# Patient Record
Sex: Male | Born: 1965 | Race: White | Hispanic: No | Smoking: Never smoker
Health system: Southern US, Community
[De-identification: ages and names within clinical notes are randomized; demographics above are authoritative.]

## PROBLEM LIST (undated history)

## (undated) DIAGNOSIS — K219 Gastro-esophageal reflux disease without esophagitis: Secondary | ICD-10-CM

## (undated) DIAGNOSIS — M1990 Unspecified osteoarthritis, unspecified site: Secondary | ICD-10-CM

## (undated) HISTORY — PX: ANKLE SURGERY: SHX546

## (undated) HISTORY — PX: REFRACTIVE SURGERY: SHX103

---

## 2014-03-25 ENCOUNTER — Other Ambulatory Visit: Payer: Self-pay | Admitting: Orthopedic Surgery

## 2014-04-09 NOTE — Pre-Procedure Instructions (Signed)
Kathrynn Speedhomas Granade  04/09/2014   Your procedure is scheduled on:  Mon, Jan 18 @ 7:30 AM  [facility]  at 5:30 AM.  Call this number if you have problems the morning of surgery: [phone]   Remember:   Do not eat food or drink liquids after midnight.   Take these medicines the morning of surgery with A SIP OF WATER:    PEPCID, TRAMADOL IF NEEDED (STOP  NABUMETONE)               No Goody's,BC's,Aleve,Aspirin,Ibuprofen,Fish Oil,or any Herbal Medications   Do not wear jewelry  Do not wear lotions, powders, or colognes. You may wear deodorant.  Men may shave face and neck.  Do not bring valuables to the hospital.  [facility] is not responsible                  for any belongings or valuables.               Contacts, dentures or bridgework may not be worn into surgery.  Leave suitcase in the car. After surgery it may be brought to your room.  For patients admitted to the hospital, discharge time is determined by your                treatment team.                   Special Instructions:  [facility] - Preparing for Surgery  Before surgery, you can play an important role.  Because skin is not sterile, your skin needs to be as free of germs as possible.  You can reduce the number of germs on you skin by washing with CHG (chlorahexidine gluconate) soap before surgery.  CHG is an antiseptic cleaner which kills germs and bonds with the skin to continue killing germs even after washing.  Please DO NOT use if you have an allergy to CHG or antibacterial soaps.  If your skin becomes reddened/irritated stop using the CHG and inform your nurse when you arrive at Short Stay.  Do not shave (including legs and underarms) for at least 48 hours prior to the first CHG shower.  You may shave your face.  Please follow these instructions carefully:   1.  Shower with CHG Soap the night before surgery and the                                morning of Surgery.  2.  If you choose to wash your  hair, wash your hair first as usual with your       normal shampoo.  3.  After you shampoo, rinse your hair and body thoroughly to remove the                      Shampoo.  4.  Use CHG as you would any other liquid soap.  You can apply chg directly       to the skin and wash gently with scrungie or a clean washcloth.  5.  Apply the CHG Soap to your body ONLY FROM THE NECK DOWN.        Do not use on open wounds or open sores.  Avoid contact with your eyes,       ears, mouth and genitals (private parts).  Wash genitals (private parts)       with your  normal soap.  6.  Wash thoroughly, paying special attention to the area where your surgery        will be performed.  7.  Thoroughly rinse your body with warm water from the neck down.  8.  DO NOT shower/wash with your normal soap after using and rinsing off       the CHG Soap.  9.  Pat yourself dry with a clean towel.            10.  Wear clean pajamas.            11.  Place clean sheets on your bed the night of your first shower and do not        sleep with pets.  Day of Surgery  Do not apply any lotions/deoderants the morning of surgery.  Please wear clean clothes to the hospital/surgery center.     Please read over the following fact sheets that you were given: Pain Booklet, Coughing and Deep Breathing, Blood Transfusion Information, MRSA Information and Surgical Site Infection Prevention

## 2014-04-10 ENCOUNTER — Encounter (HOSPITAL_COMMUNITY): Payer: Self-pay

## 2014-04-10 ENCOUNTER — Encounter (HOSPITAL_COMMUNITY)
Admission: RE | Admit: 2014-04-10 | Discharge: 2014-04-10 | Disposition: A | Discharge status: Home / Self Care | Payer: 59 | Source: Ambulatory Visit | Attending: Orthopedic Surgery | Admitting: Orthopedic Surgery

## 2014-04-10 DIAGNOSIS — Z01818 Encounter for other preprocedural examination: Secondary | ICD-10-CM | POA: Diagnosis not present

## 2014-04-10 HISTORY — DX: Gastro-esophageal reflux disease without esophagitis: K21.9

## 2014-04-10 LAB — PROTIME-INR
INR: 0.98 (ref 0.00–1.49)
Prothrombin Time: 13.1 seconds (ref 11.6–15.2)

## 2014-04-10 LAB — BASIC METABOLIC PANEL
ANION GAP: 5 (ref 5–15)
BUN: 12 mg/dL (ref 6–23)
CO2: 29 mmol/L (ref 19–32)
Calcium: 9.2 mg/dL (ref 8.4–10.5)
Chloride: 108 mEq/L (ref 96–112)
Creatinine, Ser: 1.14 mg/dL (ref 0.50–1.35)
GFR, EST AFRICAN AMERICAN: 86 mL/min — AB (ref >90)
GFR, EST NON AFRICAN AMERICAN: 74 mL/min — AB (ref >90)
GLUCOSE: 98 mg/dL (ref 70–99)
POTASSIUM: 4.3 mmol/L (ref 3.5–5.1)
SODIUM: 142 mmol/L (ref 135–145)

## 2014-04-10 LAB — CBC WITH DIFFERENTIAL/PLATELET
BASOS ABS: 0 10*3/uL (ref 0.0–0.1)
Basophils Relative: 0 % (ref 0–1)
EOS ABS: 0.1 10*3/uL (ref 0.0–0.7)
EOS PCT: 2 % (ref 0–5)
HEMATOCRIT: 39.5 % (ref 39.0–52.0)
HEMOGLOBIN: 14.1 g/dL (ref 13.0–17.0)
LYMPHS PCT: 24 % (ref 12–46)
Lymphs Abs: 1.2 10*3/uL (ref 0.7–4.0)
MCH: 29.7 pg (ref 26.0–34.0)
MCHC: 35.7 g/dL (ref 30.0–36.0)
MCV: 83.3 fL (ref 78.0–100.0)
Monocytes Absolute: 0.4 10*3/uL (ref 0.1–1.0)
Monocytes Relative: 9 % (ref 3–12)
NEUTROS PCT: 65 % (ref 43–77)
Neutro Abs: 3.3 10*3/uL (ref 1.7–7.7)
PLATELETS: 176 10*3/uL (ref 150–400)
RBC: 4.74 MIL/uL (ref 4.22–5.81)
RDW: 12.1 % (ref 11.5–15.5)
WBC: 5 10*3/uL (ref 4.0–10.5)

## 2014-04-10 LAB — URINALYSIS, ROUTINE W REFLEX MICROSCOPIC
Bilirubin Urine: NEGATIVE
Glucose, UA: NEGATIVE mg/dL
HGB URINE DIPSTICK: NEGATIVE
Ketones, ur: NEGATIVE mg/dL
LEUKOCYTES UA: NEGATIVE
NITRITE: NEGATIVE
PROTEIN: NEGATIVE mg/dL
Specific Gravity, Urine: 1.018 (ref 1.005–1.030)
Urobilinogen, UA: 0.2 mg/dL (ref 0.0–1.0)
pH: 6 (ref 5.0–8.0)

## 2014-04-10 LAB — SURGICAL PCR SCREEN
MRSA, PCR: NEGATIVE
STAPHYLOCOCCUS AUREUS: POSITIVE — AB

## 2014-04-10 LAB — PREPARE RBC (CROSSMATCH)

## 2014-04-10 LAB — ABO/RH: ABO/RH(D): A POS

## 2014-04-10 LAB — APTT: APTT: 28 s (ref 24–37)

## 2014-04-10 NOTE — Progress Notes (Signed)
CALLED [facility] INTO [facility] ON [address].

## 2014-04-17 NOTE — H&P (Signed)
TOTAL HIP ADMISSION H&P  Patient is admitted for bilaterally total hip arthroplasty.  Subjective:  Chief Complaint: bilaterally hip pain  HPI: Darryl Pacheco, 49 y.o. male, has a history of pain and functional disability in the bilaterally hip(s) due to arthritis and patient has failed non-surgical conservative treatments for greater than 12 weeks to include NSAID's and/or analgesics, corticosteriod injections, flexibility and strengthening excercises, supervised PT with diminished ADL's post treatment, weight reduction as appropriate and activity modification.  Onset of symptoms was gradual starting 4 years ago with gradually worsening course since that time.The patient noted no past surgery on the bilaterally hip(s).  Patient currently rates pain in the bilaterally hip at 10 out of 10 with activity. Patient has night pain, worsening of pain with activity and weight bearing, pain that interfers with activities of daily living, pain with passive range of motion and joint swelling. Patient has evidence of periarticular osteophytes and joint space narrowing by imaging studies. This condition presents safety issues increasing the risk of falls.   There is no current active infection.  There are no active problems to display for this patient.  Past Medical History  Diagnosis Date  . GERD (gastroesophageal reflux disease)     Past Surgical History  Procedure Laterality Date  . Ankle surgery      right     1982   . Refractive surgery      bil       No prescriptions prior to admission   Allergies  Allergen Reactions  . Other     Aged Cheeses - cause stomach problems  . Penicillins     Childhood reactions    History  Substance Use Topics  . Smoking status: Never Smoker   . Smokeless tobacco: Not on file  . Alcohol Use: Yes     Comment: social       No family history on file.   Review of Systems  Constitutional: Negative.   HENT: Positive for hearing loss.   Eyes: Negative.    Respiratory: Negative.   Cardiovascular: Negative.   Gastrointestinal: Negative.   Genitourinary: Negative.   Musculoskeletal: Positive for joint pain.  Skin: Negative.   Neurological: Negative.   Endo/Heme/Allergies: Negative.   Psychiatric/Behavioral: Negative.     Objective:  Physical Exam  Constitutional: He is oriented to person, place, and time. He appears well-developed and well-nourished.  HENT:  Head: Normocephalic and atraumatic.  Eyes: Pupils are equal, round, and reactive to light.  Neck: Normal range of motion. Neck supple.  Cardiovascular: Intact distal pulses.   Respiratory: Effort normal.  Musculoskeletal:  Skin over both hips is intact.  Internal rotation causes significant pain he walks with a bilateral limp, foot tap is negative and he is neurovascular intact distally.  Neurological: He is alert and oriented to person, place, and time.  Skin: Skin is warm and dry.  Psychiatric: He has a normal mood and affect. His behavior is normal. Judgment and thought content normal.    Vital signs in last 24 hours:    Labs:   There is no height or weight on file to calculate BMI.   Imaging Review Plain radiographs demonstrate AP pelvis and crosstable lateral of both hips ordered and interpreted by me today show bone-on-bone arthritic changes fairly large peripheral osteophytes with a symmetric cartilage loss.    Assessment/Plan:  End stage arthritis, bilaterally hip(s)  The patient history, physical examination, clinical judgement of the provider and imaging studies are consistent with end stage degenerative  joint disease of the bilaterally hip(s) and total hip arthroplasty is deemed medically necessary. The treatment options including medical management, injection therapy, arthroscopy and arthroplasty were discussed at length. The risks and benefits of total hip arthroplasty were presented and reviewed. The risks due to aseptic loosening, infection, stiffness,  dislocation/subluxation,  thromboembolic complications and other imponderables were discussed.  The patient acknowledged the explanation, agreed to proceed with the plan and consent was signed. Patient is being admitted for inpatient treatment for surgery, pain control, PT, OT, prophylactic antibiotics, VTE prophylaxis, progressive ambulation and ADL's and discharge planning.The patient is planning to be discharged to skilled nursing facility

## 2014-04-19 DIAGNOSIS — M160 Bilateral primary osteoarthritis of hip: Secondary | ICD-10-CM | POA: Diagnosis present

## 2014-04-19 MED ORDER — CHLORHEXIDINE GLUCONATE 4 % EX LIQD
60.0000 mL | Freq: Once | CUTANEOUS | Status: DC
  Filled 2014-04-19: qty 60

## 2014-04-19 MED ORDER — CEFAZOLIN SODIUM-DEXTROSE 2-3 GM-% IV SOLR
2.0000 g | INTRAVENOUS | Status: DC
  Filled 2014-04-19: qty 50

## 2014-04-19 NOTE — Anesthesia Preprocedure Evaluation (Addendum)
Anesthesia Evaluation  Patient identified by MRN, date of birth, ID band Patient awake    Reviewed: Allergy & Precautions, NPO status , Patient's Chart, lab work & pertinent test results, reviewed documented beta blocker date and time   Airway Mallampati: II   Neck ROM: Full    Dental  (+) Teeth Intact, Dental Advisory Given   Pulmonary neg pulmonary ROS,  breath sounds clear to auscultation        Cardiovascular negative cardio ROS  Rhythm:Regular     Neuro/Psych negative neurological ROS     GI/Hepatic Neg liver ROS, GERD-  Controlled,  Endo/Other  negative endocrine ROS  Renal/GU GFR 70     Musculoskeletal   Abdominal (+)   Bowel sounds: normal.  Peds  Hematology negative hematology ROS (+)   Anesthesia Other Findings   Reproductive/Obstetrics                            Anesthesia Physical Anesthesia Plan  ASA: II  Anesthesia Plan: General   Post-op Pain Management:    Induction: Intravenous  Airway Management Planned: Oral ETT  Additional Equipment:   Intra-op Plan:   Post-operative Plan: Extubation in OR  Informed Consent: I have reviewed the patients History and Physical, chart, labs and discussed the procedure including the risks, benefits and alternatives for the proposed anesthesia with the patient or authorized representative who has indicated his/her understanding and acceptance.     Plan Discussed with:   Anesthesia Plan Comments: (Verify T&C, 2nd IV after inductiion, multimodal pain RX)        Anesthesia Quick Evaluation

## 2014-04-20 ENCOUNTER — Inpatient Hospital Stay (HOSPITAL_COMMUNITY): Payer: 59

## 2014-04-20 ENCOUNTER — Inpatient Hospital Stay (HOSPITAL_COMMUNITY)
Admission: RE | Admit: 2014-04-20 | Discharge: 2014-04-23 | DRG: 462 | Disposition: A | Discharge status: Skilled Nursing Facility | Payer: 59 | Source: Ambulatory Visit | Attending: Orthopedic Surgery | Admitting: Orthopedic Surgery

## 2014-04-20 ENCOUNTER — Inpatient Hospital Stay (HOSPITAL_COMMUNITY): Payer: 59 | Admitting: Anesthesiology

## 2014-04-20 ENCOUNTER — Encounter (HOSPITAL_COMMUNITY): Payer: Self-pay | Admitting: *Deleted

## 2014-04-20 ENCOUNTER — Encounter (HOSPITAL_COMMUNITY)
Admission: RE | Disposition: A | Discharge status: Skilled Nursing Facility | Payer: Self-pay | Source: Ambulatory Visit | Attending: Orthopedic Surgery

## 2014-04-20 DIAGNOSIS — K219 Gastro-esophageal reflux disease without esophagitis: Secondary | ICD-10-CM | POA: Diagnosis present

## 2014-04-20 DIAGNOSIS — M160 Bilateral primary osteoarthritis of hip: Principal | ICD-10-CM | POA: Diagnosis present

## 2014-04-20 DIAGNOSIS — M25552 Pain in left hip: Secondary | ICD-10-CM | POA: Diagnosis present

## 2014-04-20 DIAGNOSIS — N419 Inflammatory disease of prostate, unspecified: Secondary | ICD-10-CM | POA: Diagnosis not present

## 2014-04-20 DIAGNOSIS — Z96643 Presence of artificial hip joint, bilateral: Secondary | ICD-10-CM

## 2014-04-20 HISTORY — PX: TOTAL HIP ARTHROPLASTY: SHX124

## 2014-04-20 HISTORY — DX: Unspecified osteoarthritis, unspecified site: M19.90

## 2014-04-20 LAB — HEMOGLOBIN AND HEMATOCRIT, BLOOD
HEMATOCRIT: 28.6 % — AB (ref 39.0–52.0)
HEMOGLOBIN: 10.2 g/dL — AB (ref 13.0–17.0)

## 2014-04-20 SURGERY — ARTHROPLASTY, HIP, BILATERAL, TOTAL
Anesthesia: General | Laterality: Bilateral

## 2014-04-20 MED ORDER — MIDAZOLAM HCL 5 MG/5ML IJ SOLN
INTRAMUSCULAR | Status: DC | PRN
  Administered 2014-04-20 (×2): 2 mg via INTRAVENOUS

## 2014-04-20 MED ORDER — FENTANYL CITRATE 0.05 MG/ML IJ SOLN
25.0000 ug | INTRAMUSCULAR | Status: DC | PRN
  Administered 2014-04-20 (×3): 50 ug via INTRAVENOUS

## 2014-04-20 MED ORDER — SENNOSIDES-DOCUSATE SODIUM 8.6-50 MG PO TABS: 1.0000 | ORAL_TABLET | Freq: Every evening | ORAL | Status: DC | PRN

## 2014-04-20 MED ORDER — DEXTROSE-NACL 5-0.45 % IV SOLN: INTRAVENOUS | Status: DC

## 2014-04-20 MED ORDER — KCL IN DEXTROSE-NACL 20-5-0.45 MEQ/L-%-% IV SOLN
INTRAVENOUS | Status: DC
  Administered 2014-04-20 – 2014-04-21 (×3): via INTRAVENOUS
  Filled 2014-04-20 (×13): qty 1000

## 2014-04-20 MED ORDER — METHOCARBAMOL 500 MG PO TABS
500.0000 mg | ORAL_TABLET | Freq: Four times a day (QID) | ORAL | Status: DC | PRN
  Administered 2014-04-20 – 2014-04-23 (×8): 500 mg via ORAL
  Filled 2014-04-20 (×9): qty 1

## 2014-04-20 MED ORDER — METHOCARBAMOL 1000 MG/10ML IJ SOLN
500.0000 mg | Freq: Four times a day (QID) | INTRAVENOUS | Status: DC | PRN
  Filled 2014-04-20: qty 5

## 2014-04-20 MED ORDER — MAGNESIUM CITRATE PO SOLN: 1.0000 | Freq: Once | ORAL | Status: AC | PRN

## 2014-04-20 MED ORDER — PHENOL 1.4 % MT LIQD: 1.0000 | OROMUCOSAL | Status: DC | PRN

## 2014-04-20 MED ORDER — LACTATED RINGERS IV SOLN
INTRAVENOUS | Status: DC | PRN
  Administered 2014-04-20 (×3): via INTRAVENOUS

## 2014-04-20 MED ORDER — METOCLOPRAMIDE HCL 10 MG PO TABS
5.0000 mg | ORAL_TABLET | Freq: Three times a day (TID) | ORAL | Status: DC | PRN
  Administered 2014-04-23: 5 mg via ORAL
  Filled 2014-04-20: qty 1

## 2014-04-20 MED ORDER — LIDOCAINE HCL (CARDIAC) 20 MG/ML IV SOLN
INTRAVENOUS | Status: DC | PRN
  Administered 2014-04-20: 180 mg via INTRAVENOUS

## 2014-04-20 MED ORDER — ASPIRIN EC 325 MG PO TBEC
325.0000 mg | DELAYED_RELEASE_TABLET | Freq: Every day | ORAL | Status: DC
  Administered 2014-04-21 – 2014-04-23 (×3): 325 mg via ORAL
  Filled 2014-04-20 (×4): qty 1

## 2014-04-20 MED ORDER — GLYCOPYRROLATE 0.2 MG/ML IJ SOLN
INTRAMUSCULAR | Status: DC | PRN
  Administered 2014-04-20: 0.6 mg via INTRAVENOUS

## 2014-04-20 MED ORDER — BISACODYL 5 MG PO TBEC
5.0000 mg | DELAYED_RELEASE_TABLET | Freq: Every day | ORAL | Status: DC | PRN
  Administered 2014-04-23: 5 mg via ORAL
  Filled 2014-04-20: qty 1

## 2014-04-20 MED ORDER — METOCLOPRAMIDE HCL 5 MG/ML IJ SOLN: 5.0000 mg | Freq: Three times a day (TID) | INTRAMUSCULAR | Status: DC | PRN

## 2014-04-20 MED ORDER — DOCUSATE SODIUM 100 MG PO CAPS
100.0000 mg | ORAL_CAPSULE | Freq: Two times a day (BID) | ORAL | Status: DC
  Administered 2014-04-20 – 2014-04-23 (×6): 100 mg via ORAL
  Filled 2014-04-20 (×8): qty 1

## 2014-04-20 MED ORDER — OXYCODONE HCL 5 MG PO TABS
5.0000 mg | ORAL_TABLET | ORAL | Status: DC | PRN
  Administered 2014-04-20 – 2014-04-21 (×7): 10 mg via ORAL
  Administered 2014-04-22: 5 mg via ORAL
  Administered 2014-04-22 (×4): 10 mg via ORAL
  Administered 2014-04-23 (×2): 5 mg via ORAL
  Administered 2014-04-23: 10 mg via ORAL
  Filled 2014-04-20 (×8): qty 2
  Filled 2014-04-20: qty 1
  Filled 2014-04-20 (×4): qty 2
  Filled 2014-04-20 (×2): qty 1

## 2014-04-20 MED ORDER — DEXAMETHASONE SODIUM PHOSPHATE 10 MG/ML IJ SOLN
INTRAMUSCULAR | Status: DC | PRN
  Administered 2014-04-20: 10 mg via INTRAVENOUS

## 2014-04-20 MED ORDER — METHOCARBAMOL 1000 MG/10ML IJ SOLN
500.0000 mg | INTRAVENOUS | Status: AC
  Administered 2014-04-20: 500 mg via INTRAVENOUS
  Filled 2014-04-20: qty 5

## 2014-04-20 MED ORDER — MIDAZOLAM HCL 2 MG/2ML IJ SOLN
INTRAMUSCULAR | Status: AC
  Filled 2014-04-20: qty 2

## 2014-04-20 MED ORDER — ONDANSETRON HCL 4 MG PO TABS: 4.0000 mg | ORAL_TABLET | Freq: Four times a day (QID) | ORAL | Status: DC | PRN

## 2014-04-20 MED ORDER — PROPOFOL 10 MG/ML IV BOLUS
INTRAVENOUS | Status: DC | PRN
Start: 2014-04-20 — End: 2014-04-20
  Administered 2014-04-20: 170 mg via INTRAVENOUS

## 2014-04-20 MED ORDER — ONDANSETRON HCL 4 MG/2ML IJ SOLN: 4.0000 mg | Freq: Four times a day (QID) | INTRAMUSCULAR | Status: DC | PRN

## 2014-04-20 MED ORDER — ONDANSETRON HCL 4 MG/2ML IJ SOLN
INTRAMUSCULAR | Status: DC | PRN
  Administered 2014-04-20: 4 mg via INTRAVENOUS

## 2014-04-20 MED ORDER — FENTANYL CITRATE 0.05 MG/ML IJ SOLN
INTRAMUSCULAR | Status: AC
  Filled 2014-04-20: qty 2

## 2014-04-20 MED ORDER — TRANEXAMIC ACID 100 MG/ML IV SOLN
1000.0000 mg | INTRAVENOUS | Status: AC
  Administered 2014-04-20: 1000 mg via INTRAVENOUS
  Filled 2014-04-20: qty 10

## 2014-04-20 MED ORDER — DIPHENHYDRAMINE HCL 12.5 MG/5ML PO ELIX: 12.5000 mg | ORAL_SOLUTION | ORAL | Status: DC | PRN

## 2014-04-20 MED ORDER — ACETAMINOPHEN 10 MG/ML IV SOLN
INTRAVENOUS | Status: AC
  Filled 2014-04-20: qty 100

## 2014-04-20 MED ORDER — FENTANYL CITRATE 0.05 MG/ML IJ SOLN
INTRAMUSCULAR | Status: DC | PRN
  Administered 2014-04-20: 75 ug via INTRAVENOUS
  Administered 2014-04-20 (×3): 50 ug via INTRAVENOUS
  Administered 2014-04-20: 100 ug via INTRAVENOUS
  Administered 2014-04-20: 25 ug via INTRAVENOUS
  Administered 2014-04-20: 50 ug via INTRAVENOUS

## 2014-04-20 MED ORDER — HYDROMORPHONE HCL 1 MG/ML IJ SOLN
1.0000 mg | INTRAMUSCULAR | Status: DC | PRN
  Administered 2014-04-20 – 2014-04-22 (×9): 1 mg via INTRAVENOUS
  Filled 2014-04-20 (×9): qty 1

## 2014-04-20 MED ORDER — LORATADINE 10 MG PO TABS
10.0000 mg | ORAL_TABLET | Freq: Every day | ORAL | Status: DC | PRN
  Filled 2014-04-20: qty 1

## 2014-04-20 MED ORDER — NEOSTIGMINE METHYLSULFATE 10 MG/10ML IV SOLN
INTRAVENOUS | Status: DC | PRN
  Administered 2014-04-20: 4 mg via INTRAVENOUS

## 2014-04-20 MED ORDER — ACETAMINOPHEN 325 MG PO TABS
650.0000 mg | ORAL_TABLET | Freq: Four times a day (QID) | ORAL | Status: DC | PRN
  Administered 2014-04-21 – 2014-04-23 (×4): 650 mg via ORAL
  Filled 2014-04-20 (×5): qty 2

## 2014-04-20 MED ORDER — CEFUROXIME SODIUM 1.5 G IJ SOLR
INTRAMUSCULAR | Status: AC
  Filled 2014-04-20: qty 1.5

## 2014-04-20 MED ORDER — ONDANSETRON HCL 4 MG/2ML IJ SOLN
INTRAMUSCULAR | Status: AC
  Filled 2014-04-20: qty 2

## 2014-04-20 MED ORDER — LIDOCAINE HCL (CARDIAC) 20 MG/ML IV SOLN
INTRAVENOUS | Status: AC
  Filled 2014-04-20: qty 5

## 2014-04-20 MED ORDER — GLYCOPYRROLATE 0.2 MG/ML IJ SOLN
INTRAMUSCULAR | Status: AC
  Filled 2014-04-20: qty 3

## 2014-04-20 MED ORDER — METHOCARBAMOL 500 MG PO TABS: 500.0000 mg | ORAL_TABLET | Freq: Two times a day (BID) | ORAL | Status: DC

## 2014-04-20 MED ORDER — ASPIRIN EC 325 MG PO TBEC: 325.0000 mg | DELAYED_RELEASE_TABLET | Freq: Two times a day (BID) | ORAL | Status: DC

## 2014-04-20 MED ORDER — FENTANYL CITRATE 0.05 MG/ML IJ SOLN
INTRAMUSCULAR | Status: AC
  Filled 2014-04-20: qty 5

## 2014-04-20 MED ORDER — BUPIVACAINE-EPINEPHRINE (PF) 0.5% -1:200000 IJ SOLN
INTRAMUSCULAR | Status: AC
  Filled 2014-04-20: qty 30

## 2014-04-20 MED ORDER — NEOSTIGMINE METHYLSULFATE 10 MG/10ML IV SOLN
INTRAVENOUS | Status: AC
  Filled 2014-04-20: qty 1

## 2014-04-20 MED ORDER — OXYCODONE-ACETAMINOPHEN 5-325 MG PO TABS: 1.0000 | ORAL_TABLET | ORAL | Status: DC | PRN

## 2014-04-20 MED ORDER — PROPOFOL 10 MG/ML IV BOLUS
INTRAVENOUS | Status: AC
  Filled 2014-04-20: qty 20

## 2014-04-20 MED ORDER — ALBUMIN HUMAN 5 % IV SOLN
INTRAVENOUS | Status: DC | PRN
  Administered 2014-04-20 (×2): via INTRAVENOUS

## 2014-04-20 MED ORDER — MEPERIDINE HCL 25 MG/ML IJ SOLN: 6.2500 mg | INTRAMUSCULAR | Status: DC | PRN

## 2014-04-20 MED ORDER — FENTANYL CITRATE 0.05 MG/ML IJ SOLN
INTRAMUSCULAR | Status: AC
  Administered 2014-04-20: 50 ug via INTRAVENOUS
  Filled 2014-04-20: qty 2

## 2014-04-20 MED ORDER — PROMETHAZINE HCL 25 MG/ML IJ SOLN: 6.2500 mg | INTRAMUSCULAR | Status: DC | PRN

## 2014-04-20 MED ORDER — ACETAMINOPHEN 10 MG/ML IV SOLN
INTRAVENOUS | Status: DC | PRN
  Administered 2014-04-20: 1000 mg via INTRAVENOUS

## 2014-04-20 MED ORDER — BUPIVACAINE-EPINEPHRINE 0.5% -1:200000 IJ SOLN
INTRAMUSCULAR | Status: DC | PRN
Start: 2014-04-20 — End: 2014-04-20
  Administered 2014-04-20 (×2): 10 mL

## 2014-04-20 MED ORDER — FAMOTIDINE 10 MG PO TABS
10.0000 mg | ORAL_TABLET | Freq: Every day | ORAL | Status: DC
  Administered 2014-04-21 – 2014-04-23 (×3): 10 mg via ORAL
  Filled 2014-04-20 (×4): qty 1

## 2014-04-20 MED ORDER — MENTHOL 3 MG MT LOZG: 1.0000 | LOZENGE | OROMUCOSAL | Status: DC | PRN

## 2014-04-20 MED ORDER — ROCURONIUM BROMIDE 100 MG/10ML IV SOLN
INTRAVENOUS | Status: DC | PRN
  Administered 2014-04-20 (×4): 10 mg via INTRAVENOUS
  Administered 2014-04-20: 50 mg via INTRAVENOUS

## 2014-04-20 MED ORDER — CEFAZOLIN SODIUM-DEXTROSE 2-3 GM-% IV SOLR
INTRAVENOUS | Status: DC | PRN
  Administered 2014-04-20: 2 g via INTRAVENOUS

## 2014-04-20 MED ORDER — ROCURONIUM BROMIDE 50 MG/5ML IV SOLN
INTRAVENOUS | Status: AC
  Filled 2014-04-20: qty 1

## 2014-04-20 MED ORDER — SODIUM CHLORIDE 0.9 % IV SOLN
10.0000 mg | INTRAVENOUS | Status: DC | PRN
  Administered 2014-04-20: 10 ug/min via INTRAVENOUS

## 2014-04-20 MED ORDER — ACETAMINOPHEN 650 MG RE SUPP: 650.0000 mg | Freq: Four times a day (QID) | RECTAL | Status: DC | PRN

## 2014-04-20 SURGICAL SUPPLY — 63 items
BLADE SAW SGTL 18X1.27X75 (BLADE) ×2 IMPLANT
BLADE SAW SGTL 18X1.27X75MM (BLADE) ×1
BLADE SURG 10 STRL SS (BLADE) ×6 IMPLANT
BNDG COHESIVE 6X5 TAN STRL LF (GAUZE/BANDAGES/DRESSINGS) ×3 IMPLANT
BRUSH FEMORAL CANAL (MISCELLANEOUS) IMPLANT
CAPT HIP TOTAL 2 ×6 IMPLANT
CLOTH BEACON ORANGE TIMEOUT ST (SAFETY) IMPLANT
COVER BACK TABLE 24X17X13 BIG (DRAPES) IMPLANT
COVER SURGICAL LIGHT HANDLE (MISCELLANEOUS) ×6 IMPLANT
DRAPE IMP U-DRAPE 54X76 (DRAPES) ×3 IMPLANT
DRAPE INCISE IOBAN 66X45 STRL (DRAPES) ×3 IMPLANT
DRAPE ORTHO SPLIT 77X108 STRL (DRAPES) ×8
DRAPE PROXIMA HALF (DRAPES) ×6 IMPLANT
DRAPE SURG ORHT 6 SPLT 77X108 (DRAPES) ×4 IMPLANT
DRAPE U-SHAPE 47X51 STRL (DRAPES) ×6 IMPLANT
DRILL BIT 7/64X5 (BIT) ×3 IMPLANT
DRSG AQUACEL AG ADV 3.5X10 (GAUZE/BANDAGES/DRESSINGS) ×6 IMPLANT
DURAPREP 26ML APPLICATOR (WOUND CARE) ×6 IMPLANT
ELECT BLADE 4.0 EZ CLEAN MEGAD (MISCELLANEOUS) ×3
ELECT REM PT RETURN 9FT ADLT (ELECTROSURGICAL) ×6
ELECTRODE BLDE 4.0 EZ CLN MEGD (MISCELLANEOUS) ×1 IMPLANT
ELECTRODE REM PT RTRN 9FT ADLT (ELECTROSURGICAL) ×2 IMPLANT
GAUZE XEROFORM 1X8 LF (GAUZE/BANDAGES/DRESSINGS) IMPLANT
GLOVE BIO SURGEON STRL SZ7.5 (GLOVE) ×12 IMPLANT
GLOVE BIO SURGEON STRL SZ8.5 (GLOVE) ×9 IMPLANT
GLOVE BIOGEL PI IND STRL 8 (GLOVE) ×4 IMPLANT
GLOVE BIOGEL PI IND STRL 9 (GLOVE) ×2 IMPLANT
GLOVE BIOGEL PI INDICATOR 8 (GLOVE) ×8
GLOVE BIOGEL PI INDICATOR 9 (GLOVE) ×4
GOWN STRL REUS W/ TWL LRG LVL3 (GOWN DISPOSABLE) ×2 IMPLANT
GOWN STRL REUS W/ TWL XL LVL3 (GOWN DISPOSABLE) ×4 IMPLANT
GOWN STRL REUS W/TWL LRG LVL3 (GOWN DISPOSABLE) ×4
GOWN STRL REUS W/TWL XL LVL3 (GOWN DISPOSABLE) ×8
HANDPIECE INTERPULSE COAX TIP (DISPOSABLE)
HOOD PEEL AWAY FACE SHEILD DIS (HOOD) ×12 IMPLANT
KIT BASIN OR (CUSTOM PROCEDURE TRAY) ×3 IMPLANT
KIT ROOM TURNOVER OR (KITS) ×3 IMPLANT
MANIFOLD NEPTUNE II (INSTRUMENTS) ×3 IMPLANT
NEEDLE 22X1 1/2 (OR ONLY) (NEEDLE) ×3 IMPLANT
NS IRRIG 1000ML POUR BTL (IV SOLUTION) ×3 IMPLANT
PACK TOTAL JOINT (CUSTOM PROCEDURE TRAY) ×3 IMPLANT
PACK UNIVERSAL I (CUSTOM PROCEDURE TRAY) ×6 IMPLANT
PAD ARMBOARD 7.5X6 YLW CONV (MISCELLANEOUS) ×6 IMPLANT
PASSER SUT SWANSON 36MM LOOP (INSTRUMENTS) ×3 IMPLANT
PENCIL BUTTON HOLSTER BLD 10FT (ELECTRODE) ×3 IMPLANT
PRESSURIZER FEMORAL UNIV (MISCELLANEOUS) IMPLANT
SET HNDPC FAN SPRY TIP SCT (DISPOSABLE) IMPLANT
SPONGE LAP 18X18 X RAY DECT (DISPOSABLE) ×9 IMPLANT
STOCKINETTE IMPERVIOUS 9X36 MD (GAUZE/BANDAGES/DRESSINGS) ×3 IMPLANT
SUT ETHIBOND 2 V 37 (SUTURE) ×6 IMPLANT
SUT VIC AB 0 CTB1 27 (SUTURE) ×6 IMPLANT
SUT VIC AB 1 CTX 36 (SUTURE) ×4
SUT VIC AB 1 CTX36XBRD ANBCTR (SUTURE) ×2 IMPLANT
SUT VIC AB 2-0 CTB1 (SUTURE) ×6 IMPLANT
SUT VIC AB 3-0 SH 27 (SUTURE) ×4
SUT VIC AB 3-0 SH 27X BRD (SUTURE) ×2 IMPLANT
SYR CONTROL 10ML LL (SYRINGE) ×3 IMPLANT
TOWEL OR 17X24 6PK STRL BLUE (TOWEL DISPOSABLE) ×9 IMPLANT
TOWEL OR 17X26 10 PK STRL BLUE (TOWEL DISPOSABLE) ×9 IMPLANT
TOWER CARTRIDGE SMART MIX (DISPOSABLE) IMPLANT
TRAY FOLEY CATH 14FR (SET/KITS/TRAYS/PACK) ×3 IMPLANT
WATER STERILE IRR 1000ML POUR (IV SOLUTION) ×3 IMPLANT
YANKAUER SUCT BULB TIP NO VENT (SUCTIONS) ×3 IMPLANT

## 2014-04-20 NOTE — Evaluation (Signed)
Physical Therapy Evaluation Patient Details Name: Darryl Pacheco MRN: [id number] DOB: May 29, 1965 Today's Date: 04/20/2014   History of Present Illness  Pt admitted 1/18 for elective bilat posterior THA. Pt with unremarkable PMH.  Clinical Impression  Pt is s/p bilat posterior THA resulting in the deficits listed below (see PT Problem List). Pt tolerated transfers and ambulation extremely well for first time up.  Pt will benefit from skilled PT to increase their independence and safety with mobility to allow discharge to the venue listed below.      Follow Up Recommendations SNF;Supervision/Assistance - 24 hour    Equipment Recommendations  Rolling walker with 5" wheels    Recommendations for Other Services       Precautions / Restrictions Precautions Precautions: Posterior Hip Precaution Booklet Issued: Yes (comment) Precaution Comments: pt and spouse with verbal understanding Restrictions Weight Bearing Restrictions: Yes RLE Weight Bearing: Weight bearing as tolerated LLE Weight Bearing: Weight bearing as tolerated      Mobility  Bed Mobility Overal bed mobility: Needs Assistance Bed Mobility: Supine to Sit     Supine to sit: Min assist     General bed mobility comments: max directional v/c's for technique to adhere to hip precautions  Transfers Overall transfer level: Needs assistance Equipment used: Rolling walker (2 wheeled) Transfers: Sit to/from Stand Sit to Stand: Min assist         General transfer comment: v/c's for hand placement, bed raised  Ambulation/Gait Ambulation/Gait assistance: Min assist Ambulation Distance (Feet): 100 Feet Assistive device: Rolling walker (2 wheeled) Gait Pattern/deviations: Step-through pattern;Decreased stride length Gait velocity: slow/guarded   General Gait Details: mild antalgia, increased bilat UE WBing  Stairs            Wheelchair Mobility    Modified Rankin (Stroke Patients Only)       Balance                                              Pertinent Vitals/Pain Pain Assessment: 0-10 Pain Score: 6  Pain Location: bilat hips Pain Descriptors / Indicators: Sore Pain Intervention(s): Monitored during session    Home Living Family/patient expects to be discharged to:: Skilled nursing facility Living Arrangements: Spouse/significant other               Additional Comments: wife works all day    Prior Function Level of Independence: Independent         Comments: Conservation officer, historic buildingsconstruction worker/owns own business     Higher education careers adviserHand Dominance   Dominant Hand: Right    Extremity/Trunk Assessment   Upper Extremity Assessment: Overall WFL for tasks assessed           Lower Extremity Assessment: RLE deficits/detail;LLE deficits/detail RLE Deficits / Details: able to complete glut and quad sets, able to initiate hip/knee flexion LLE Deficits / Details: able to complete glut and quad sets, able to initiate hip/knee flexion  Cervical / Trunk Assessment: Normal  Communication   Communication: No difficulties  Cognition Arousal/Alertness: Awake/alert Behavior During Therapy: WFL for tasks assessed/performed Overall Cognitive Status: Within Functional Limits for tasks assessed                      General Comments      Exercises Total Joint Exercises Ankle Circles/Pumps: AROM;Both;10 reps;Supine Quad Sets: AROM;Both;10 reps;Supine Gluteal Sets: AROM;Both;10 reps;Supine Heel Slides: AROM;Both;10 reps;Supine  Assessment/Plan    PT Assessment Patient needs continued PT services  PT Diagnosis Difficulty walking;Acute pain   PT Problem List Decreased strength;Decreased range of motion;Decreased activity tolerance;Decreased balance;Decreased mobility  PT Treatment Interventions DME instruction;Gait training;Stair training;Functional mobility training;Therapeutic activities;Therapeutic exercise;Balance training   PT Goals (Current goals can be found in the  Care Plan section) Acute Rehab PT Goals Patient Stated Goal: rehab then home PT Goal Formulation: With patient Time For Goal Achievement: 04/27/14 Potential to Achieve Goals: Good    Frequency 7X/week   Barriers to discharge   pt alone during the day    Co-evaluation               End of Session Equipment Utilized During Treatment: Gait belt Activity Tolerance: Patient tolerated treatment well Patient left: in chair;with call bell/phone within reach;with family/visitor present Nurse Communication: Mobility status         Time: 1415-1443 PT Time Calculation (min) (ACUTE ONLY): 28 min   Charges:   PT Evaluation $Initial PT Evaluation Tier I: 1 Procedure PT Treatments $Gait Training: 8-22 mins   PT G CodesMarcene Brawn 04/20/2014, 4:03 PM   Lewis Shock, PT, DPT Pager #: [phone] Office #: [phone]

## 2014-04-20 NOTE — Transfer of Care (Signed)
Immediate Anesthesia Transfer of Care Note  Patient: Darryl Pacheco  Procedure(s) Performed: Procedure(s): TOTAL HIP ARTHROPLASTY BILATERAL (Bilateral)  Patient Location: PACU  Anesthesia Type:General  Level of Consciousness: awake and alert   Airway & Oxygen Therapy: Patient Spontanous Breathing and Patient connected to nasal cannula oxygen  Post-op Assessment: Report given to PACU RN and Post -op Vital signs reviewed and stable  Post vital signs: Reviewed and stable  Complications: No apparent anesthesia complications

## 2014-04-20 NOTE — Interval H&P Note (Signed)
History and Physical Interval Note:  04/20/2014 7:20 AM  Darryl Pacheco  has presented today for surgery, with the diagnosis of BILATERAL HIP DEGENERATIVE JOINT DISEASE  The various methods of treatment have been discussed with the patient and family. After consideration of risks, benefits and other options for treatment, the patient has consented to  Procedure(s): TOTAL HIP ARTHROPLASTY BILATERAL (Bilateral) as a surgical intervention .  The patient's history has been reviewed, patient examined, no change in status, stable for surgery.  I have reviewed the patient's chart and labs.  Questions were answered to the patient's satisfaction.     Nestor LewandowskyOWAN,Kelvyn Schunk J

## 2014-04-20 NOTE — Op Note (Signed)
OPERATIVE REPORT    DATE OF PROCEDURE:  04/20/2014       PREOPERATIVE DIAGNOSIS:  BILATERAL HIP DEGENERATIVE JOINT DISEASE                                                          POSTOPERATIVE DIAGNOSIS:  BILATERAL HIP DEGENERATIVE JOINT DISEASE                                                           PROCEDURE:  B total hip arthroplasty using a 54 mm DePuy Pinnacle  Cup, [employer], 10-degree polyethylene liner index superior  and posterior, a +0 36 mm ceramic head, a 18x13x150x42 SROM stem, 18FL Sleeve, eac side   SURGEON: Jemila Camille J    ASSISTANT:   Eric K. [employer]  (present throughout entire procedure and necessary for timely completion of the procedure)   ANESTHESIA: General BLOOD LOSS: 300 +300 FLUID REPLACEMENT: 2000 crystalloid DRAINS: Foley Catheter URINE OUTPUT: 400cc COMPLICATIONS: none    INDICATIONS FOR PROCEDURE: A 49 y.o. year-old With  BILATERAL HIP DEGENERATIVE JOINT DISEASE   for 5 years, x-rays show bone-on-bone arthritic changes. Despite conservative measures with observation, anti-inflammatory medicine, narcotics, use of a cane, has severe unremitting pain and can ambulate only a few blocks before resting.  Patient desires elective B total hip arthroplasty to decrease pain and increase function. The risks, benefits, and alternatives were discussed at length including but not limited to the risks of infection, bleeding, nerve injury, stiffness, blood clots, the need for revision surgery, cardiopulmonary complications, among others, and they were willing to proceed. Questions answered     PROCEDURE IN DETAIL: The patient was identified by armband,  received preoperative IV antibiotics in the holding area at [facility], taken to the operating room , appropriate anesthetic monitors  were attached and general endotracheal anesthesia induced. Foley catheter was inserted. Pt was rolled into the L lateral decubitus position and fixed there  with a Stulberg Mark II pelvic clamp.  The R lower extremity was then prepped and draped  in the usual sterile fashion from the ankle to the hemipelvis. A time-out  procedure was performed. The skin along the lateral hip and thigh  infiltrated with 10 mL of 0.5% Marcaine and epinephrine solution. We  then made a posterolateral approach to the hip. With a #10 blade, a 14 cm  incision was made through the skin and subcutaneous tissue down to the level of the  IT band. Small bleeders were identified and cauterized. The IT band was cut in  line with skin incision exposing the greater trochanter. A Cobra retractor was placed between the gluteus minimus and the superior hip joint capsule, and a spiked Cobra between the quadratus femoris and the inferior hip joint capsule. This isolated the short  external rotators and piriformis tendons. These were tagged with a #2 Ethibond  suture and cut off their insertion on the intertrochanteric crest. The posterior  capsule was then developed into an acetabular-based flap from Posterior Superior off of the acetabulum out over the femoral neck and back posterior inferior to the acetabular  rim. This flap was tagged with two #2 Ethibond sutures and retracted protecting the sciatic nerve. This exposed the arthritic femoral head and osteophytes. The hip was then flexed and internally rotated, dislocating the femoral head and a standard neck cut performed 1 fingerbreadth above the lesser trochanter.  A spiked Cobra was placed in the cotyloid notch and a Hohmann retractor was then used to lever the femur anteriorly off of the anterior pelvic column. A posterior-inferior wing retractor was placed at the junction of the acetabulum and the ischium completing the acetabular exposure.We then removed the peripheral osteophytes and labrum from the acetabulum. We then reamed the acetabulum up to 53 mm with basket reamers obtaining good coverage in all quadrants. We then irrigated with  normal  saline solution and hammered into place a 54 mm pinnacle cup in 45  degrees of abduction and about 20 degrees of anteversion. More  peripheral osteophytes removed and a trial 10-degree liner placed with the  index superior-posterior. The hip was then flexed and internally rotated exposing the  proximal femur, which was entered with the initiating reamer followed by  the axial reamers up to a 13.5 mm full depth and 14mm partial depth. We then conically reamed to 64F to the correct depth for a 42 base neck. The calcar was milled to 64FL. A trial cone and stem was inserted in the 25 degrees anteversion, with a +0 36mm trial head. Trial reduction was then performed and excellent stability was noted with at 90 of flexion with 75 of internal rotation and then full extension with maximal external rotation. The hip could not be dislocated in full extension. The knee could easily flex  to about 130 degrees. We also stretched the abductors at this point,  because of the preexisting adductor contractures. All trial components  were then removed. The acetabulum was irrigated out with normal saline  solution. A titanium [facility]pex [facility] Eliminator was then screwed into place  followed by a 10-degree polyethylene liner index superior-posterior. On  the femoral side a 64FL ZTT1 sleeve was hammered into place, followed by a 18x13x150x42 SROM stem in 25 degrees of anteversion. At this point, a +0 36 mm ceramic head was  hammered on the stem. The hip was reduced. We checked our stability  one more time and found it to be excellent. The wound was once again  thoroughly irrigated out with normal saline solution pulse lavage. The  capsular flap and short external rotators were repaired back to the  intertrochanteric crest through drill holes with a #2 Ethibond suture.  The IT band was closed with running 1 Vicryl suture. The subcutaneous  tissue with 0 and 2-0 undyed Vicryl suture and the skin with running   interlocking 3-0 nylon suture. Dressing of Xeroform and Mepilex was  then applied.   The patient was then taken out of the hip clamp rolled supine and being careful not to apply any shear to the right hip dressing placed in the right lateral decubitus position, and the left lower extremity was then prepped and draped in usual sterile fashion. The left hip was then replaced using the exact same approach, technique, and exact same size implants that were used on the right side. Once we completed the procedure on the left side, the patient was then unclamped, rolled supine, awaken extubated and taken to recovery room without difficulty in stable condition.   Elea Holtzclaw J 04/20/2014, 10:08 AM

## 2014-04-20 NOTE — Anesthesia Procedure Notes (Signed)
Procedure Name: Intubation Date/Time: 04/20/2014 7:42 AM Performed by: Armandina GemmaMIRARCHI, Yazid Pop Pre-anesthesia Checklist: Patient identified, Timeout performed, Emergency Drugs available, Suction available and Patient being monitored Patient Re-evaluated:Patient Re-evaluated prior to inductionOxygen Delivery Method: Circle system utilized Preoxygenation: Pre-oxygenation with 100% oxygen Intubation Type: IV induction Ventilation: Mask ventilation without difficulty Laryngoscope Size: Miller and 2 Grade View: Grade I Tube type: Oral Tube size: 7.5 mm Number of attempts: 1 Airway Equipment and Method: Stylet Placement Confirmation: ETT inserted through vocal cords under direct vision and breath sounds checked- equal and bilateral Secured at: 22 cm Tube secured with: Tape Dental Injury: Teeth and Oropharynx as per pre-operative assessment  Comments: IV induction Manney- intubation AM CRNA- atraumatic- chipped front teeth preop- all teeth unchanged after laryngoscopy- as preop- bilat BS

## 2014-04-20 NOTE — Anesthesia Postprocedure Evaluation (Signed)
  Anesthesia Post-op Note  Patient: Darryl Pacheco  Procedure(s) Performed: Procedure(s): TOTAL HIP ARTHROPLASTY BILATERAL (Bilateral)  Patient Location: PACU  Anesthesia Type:General  Level of Consciousness: awake and alert   Airway and Oxygen Therapy: Patient Spontanous Breathing and Patient connected to nasal cannula oxygen  Post-op Pain: mild  Post-op Assessment: Post-op Vital signs reviewed, Patient's Cardiovascular Status Stable, Respiratory Function Stable and Patent Airway  Post-op Vital Signs: Reviewed and stable  Last Vitals:  Filed Vitals:   04/20/14 1036  BP: 121/64  Pulse: 88  Temp: 37.1 C  Resp: 21    Complications: No apparent anesthesia complications

## 2014-04-21 ENCOUNTER — Encounter (HOSPITAL_COMMUNITY): Payer: Self-pay | Admitting: General Practice

## 2014-04-21 LAB — CBC
HEMATOCRIT: 28 % — AB (ref 39.0–52.0)
Hemoglobin: 9.9 g/dL — ABNORMAL LOW (ref 13.0–17.0)
MCH: 30.1 pg (ref 26.0–34.0)
MCHC: 35.4 g/dL (ref 30.0–36.0)
MCV: 85.1 fL (ref 78.0–100.0)
Platelets: 184 10*3/uL (ref 150–400)
RBC: 3.29 MIL/uL — ABNORMAL LOW (ref 4.22–5.81)
RDW: 12.9 % (ref 11.5–15.5)
WBC: 8.4 10*3/uL (ref 4.0–10.5)

## 2014-04-21 LAB — BASIC METABOLIC PANEL
Anion gap: 3 — ABNORMAL LOW (ref 5–15)
BUN: 7 mg/dL (ref 6–23)
CO2: 24 mmol/L (ref 19–32)
Calcium: 8.3 mg/dL — ABNORMAL LOW (ref 8.4–10.5)
Chloride: 109 mEq/L (ref 96–112)
Creatinine, Ser: 1.1 mg/dL (ref 0.50–1.35)
GFR calc Af Amer: >90 mL/min (ref >90)
GFR, EST NON AFRICAN AMERICAN: 78 mL/min — AB (ref >90)
Glucose, Bld: 151 mg/dL — ABNORMAL HIGH (ref 70–99)
Potassium: 4.1 mmol/L (ref 3.5–5.1)
Sodium: 136 mmol/L (ref 135–145)

## 2014-04-21 NOTE — Progress Notes (Signed)
Utilization Review Completed.Darryl Pacheco T1/19/2016  

## 2014-04-21 NOTE — Clinical Social Work Psychosocial (Signed)
Clinical Social Work Department BRIEF PSYCHOSOCIAL ASSESSMENT 04/21/2014  Patient:  Darryl Pacheco, Darryl Pacheco     Account Number:  [id number]     Admit date:  04/20/2014  Clinical Social Worker:  Durward Fortes, CLINICAL SOCIAL WORKER  Date/Time:  04/21/2014 12:01 PM  Referred by:  Physician  Date Referred:  04/21/2014 Referred for  SNF Placement   Other Referral:   none.   Interview type:  Patient Other interview type:   none.    PSYCHOSOCIAL DATA Living Status:  SIGNIFICANT OTHER Admitted from facility:   Level of care:   Primary support name:  Christiana Pellant Primary support relationship to patient:  PARTNER Degree of support available:   Adequate Support.    CURRENT CONCERNS Current Concerns  Post-Acute Placement   Other Concerns:   none.    SOCIAL WORK ASSESSMENT / PLAN CSW and BSW-Intern consulted regarding SNF placement for pt once medically stable for discharge. BSW_Intern met with pt at bedside regarding discharge disposition. Pt confirmed [facility] as first choice to further rehab after discharge.    Pt expressed no further concerns at this time. CSW and BSW intern to continue to follow and assist with discharge planning needs.   Assessment/plan status:  Psychosocial Support/Ongoing Assessment of Needs Other assessment/ plan:   none.   Information/referral to community resources:   Pt to be discharged to [facility] once medically stable for discharge.    PATIENT'S/FAMILY'S RESPONSE TO PLAN OF CARE: Pt understanding and agreeable to CSW plan of care. Pt expressed no further concerns or questions at this time.       Virgie Dad Oreatha Fabry, BSW-Intern

## 2014-04-21 NOTE — Progress Notes (Signed)
Physical Therapy Treatment Patient Details Name: Darryl Pacheco MRN: [id number] DOB: 06/29/1965 Today's Date: 04/21/2014    History of Present Illness Pt admitted 1/18 for elective bilat posterior THA. Pt with unremarkable PMH.    PT Comments    Pt with increased pain/soreness this afternoon limiting ambulation this afternoon. Pt con't to require assist for sit to stand due to bilat hip precautions. COn't to benefit from ST-SNF to achieve safe mod I function for safe transition home.   Follow Up Recommendations  SNF;Supervision/Assistance - 24 hour     Equipment Recommendations  Rolling walker with 5" wheels    Recommendations for Other Services       Precautions / Restrictions Precautions Precautions: Posterior Hip Precaution Booklet Issued: Yes (comment) Precaution Comments: pt able to recall 3/3 Restrictions Weight Bearing Restrictions: No RLE Weight Bearing: Weight bearing as tolerated LLE Weight Bearing: Weight bearing as tolerated    Mobility  Bed Mobility Overal bed mobility: Needs Assistance Bed Mobility: Sit to Supine       Sit to supine: Mod assist   General bed mobility comments: assist for LE management back into bed, v/c's to use UEs to rotate self  Transfers Overall transfer level: Needs assistance Equipment used: Rolling walker (2 wheeled) Transfers: Sit to/from Stand Sit to Stand: Min assist;Mod assist         General transfer comment: pt con't to have difficulty completing standing position due to hip precautions of no hip flexion > 90 deg.   Ambulation/Gait Ambulation/Gait assistance: Min assist Ambulation Distance (Feet): 120 Feet Assistive device: Rolling walker (2 wheeled) Gait Pattern/deviations: Step-to pattern;Decreased stride length Gait velocity: slow/guarded Gait velocity interpretation: Below normal speed for age/gender General Gait Details: guarded, inc bilat UE support, encouraged bilat hip/knee flexion during swing phase  instead of circumduction   Stairs            Wheelchair Mobility    Modified Rankin (Stroke Patients Only)       Balance           Standing balance support: During functional activity Standing balance-Leahy Scale: Fair Standing balance comment: pt able to stand and urinate without LOB                    Cognition Arousal/Alertness: Awake/alert Behavior During Therapy: WFL for tasks assessed/performed Overall Cognitive Status: Within Functional Limits for tasks assessed                      Exercises      General Comments        Pertinent Vitals/Pain Pain Assessment: 0-10 Pain Score: 7  Pain Location: bilat hips Pain Descriptors / Indicators: Aching;Sore Pain Intervention(s): Monitored during session    Home Living Family/patient expects to be discharged to:: Skilled nursing facility Living Arrangements: Spouse/significant other (works all day)                  Prior Function Level of Independence: Independent      Comments: Conservation officer, historic buildings own business   PT Goals (current goals can now be found in the care plan section) Acute Rehab PT Goals Patient Stated Goal: rehab then home Progress towards PT goals: Progressing toward goals    Frequency  7X/week    PT Plan Current plan remains appropriate    Co-evaluation             End of Session Equipment Utilized During Treatment: Gait belt Activity Tolerance: Patient tolerated treatment well  Patient left: with call bell/phone within reach;with family/visitor present;in bed     Time: 1357-1430 PT Time Calculation (min) (ACUTE ONLY): 33 min  Charges:  $Gait Training: 23-37 mins                    G Codes:      Marcene BrawnChadwell, Charity Tessier Marie 04/21/2014, 2:39 PM   Lewis ShockAshly Sheera Illingworth, PT, DPT Pager #: [phone] Office #: [phone]

## 2014-04-21 NOTE — Clinical Social Work Placement (Signed)
Clinical Social Work Department CLINICAL SOCIAL WORK PLACEMENT NOTE 04/21/2014  Patient:  Darryl Pacheco,Darryl Pacheco  Account Number:  [id number] Admit date:  04/20/2014  Clinical Social Worker:  Hortencia PilarKIERRA Lilia Letterman, CLINICAL SOCIAL WORKER  Date/time:  04/21/2014 12:19 PM  Clinical Social Work is seeking post-discharge placement for this patient at the following level of care:   SKILLED NURSING   (*CSW will update this form in Epic as items are completed)   04/21/2014  Patient/family provided with [facility] Department of Clinical Social Work's list of facilities offering this level of care within the geographic area requested by the patient (or if unable, by the patient's family).  04/21/2014  Patient/family informed of their freedom to choose among providers that offer the needed level of care, that participate in Medicare, Medicaid or managed care program needed by the patient, have an available bed and are willing to accept the patient.  04/21/2014  Patient/family informed of [facility]' ownership interest in [facility], as well as of the fact that they are under no obligation to receive care at this facility.  PASARR submitted to EDS on 04/21/2014 PASARR number received on 04/21/2014  FL2 transmitted to all facilities in geographic area requested by pt/family on  04/21/2014 FL2 transmitted to all facilities within larger geographic area on   Patient informed that his/her managed care company has contracts with or will negotiate with  certain facilities, including the following:     Patient/family informed of bed offers received:  04/21/2014 [facility] Physician recommends and patient chooses bed at    [facility]   Patient to be transferred to facility by [facility] Patient and family notified of transfer on  Name of family member notified:    The following physician request were entered in Epic:   Additional  Comments:   Levora Werden S. Wendal Wilkie, BSW-Intern

## 2014-04-21 NOTE — Evaluation (Signed)
Occupational Therapy Evaluation Patient Details Name: Darryl Pacheco MRN: [id number] DOB: October 20, 1965 Today's Date: 04/21/2014    History of Present Illness Pt admitted 1/18 for elective bilat posterior THA. Pt with unremarkable PMH.   Clinical Impression   This 49 yo male admitted and underwent above presents to acute OT with Bil posterior hip precautions, decreased mobility, decreased balance all affecting his ability to care for himself and will benefit from follow up OT at SNF. Pt did mention that he felt right hip pop as he went from sit>stand from recliner (no pain associated with it at the time)    Follow Up Recommendations  SNF    Equipment Recommendations   (AE-pt to get on his own)       Precautions / Restrictions Precautions Precautions: Posterior Hip Precaution Booklet Issued: Yes (comment) Precaution Comments: reviewed precautions Restrictions Weight Bearing Restrictions: No RLE Weight Bearing: Weight bearing as tolerated LLE Weight Bearing: Weight bearing as tolerated      Mobility Bed Mobility Overal bed mobility: Needs Assistance Bed Mobility: Supine to Sit     Supine to sit: Min assist     General bed mobility comments: Pt up with PT upon arrival  Transfers Overall transfer level: Needs assistance Equipment used: Rolling walker (2 wheeled) Transfers: Sit to/from Stand Sit to Stand: Min assist         General transfer comment: assist to achieve full upright posture due to increased soreness and having to adhere to post hip precautions    Balance Overall balance assessment: Needs assistance         Standing balance support: During functional activity Standing balance-Leahy Scale: Fair Standing balance comment: pt able to stand at sink and wash hands with min guard                            ADL Overall ADL's : Needs assistance/impaired Eating/Feeding: Independent;Sitting   Grooming: Supervision/safety;Set up;Oral care;Standing    Upper Body Bathing: Set up;Sitting   Lower Body Bathing: With adaptive equipment;Adhering to hip precautions;Minimal assistance (with min A sit<>stand)   Upper Body Dressing : Set up;Sitting   Lower Body Dressing: Minimal assistance;With adaptive equipment;Adhering to hip precautions (with min A sit<>stand)   Toilet Transfer: Minimal assistance;Ambulation;BSC;RW   Toileting- Clothing Manipulation and Hygiene: Minimal assistance;Adhering to hip precautions (with min guard A sit<>stand)         General ADL Comments: Made pt aware that he could purchase AE in gift shop and that we would then change out the standard AE for the longer AE               Pertinent Vitals/Pain Pain Assessment: 0-10 Pain Score: 2  Pain Location: Bil hips Pain Descriptors / Indicators: Aching;Sore Pain Intervention(s): Monitored during session;Repositioned;Premedicated before session     Hand Dominance Right   Extremity/Trunk Assessment Upper Extremity Assessment Upper Extremity Assessment: Overall WFL for tasks assessed           Communication Communication Communication: No difficulties   Cognition Arousal/Alertness: Awake/alert Behavior During Therapy: WFL for tasks assessed/performed Overall Cognitive Status: Within Functional Limits for tasks assessed                                Home Living Family/patient expects to be discharged to:: Skilled nursing facility Living Arrangements: Spouse/significant other (works all day)  Prior Functioning/Environment Level of Independence: Independent        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    OT Diagnosis: Generalized weakness   OT Problem List: Decreased range of motion;Decreased strength;Impaired balance (sitting and/or standing);Pain;Decreased knowledge of use of DME or AE;Decreased knowledge of precautions      OT Goals(Current goals can be found in the  care plan section) Acute Rehab OT Goals Patient Stated Goal: rehab then home  OT Frequency:     Barriers to D/C: Decreased caregiver support             End of Session Equipment Utilized During Treatment: Rolling walker  Activity Tolerance: Patient tolerated treatment well Patient left: in chair;with call bell/phone within reach   Time: 0820-0946 OT Time Calculation (min): 86 min Charges:  OT General Charges $OT Visit: 1 Procedure OT Evaluation $Initial OT Evaluation Tier I: 1 Procedure OT Treatments $Self Care/Home Management : 68-82 mins  Evette GeorgesLeonard, Crystalynn Mcinerney Eva [phone] 04/21/2014, 11:40 AM

## 2014-04-21 NOTE — Progress Notes (Signed)
Patient ID: Darryl Pacheco, male   DOB: 11-29-1965, 49 y.o.   MRN: [id number] PATIENT ID: Darryl Speedhomas Jaco  MRN: [id number]  DOB/AGE:  11-29-1965 / 49 y.o.  1 Day Post-Op Procedure(s) (LRB): TOTAL HIP ARTHROPLASTY BILATERAL (Bilateral)    PROGRESS NOTE Subjective: Patient is alert, oriented, no Nausea, no Vomiting, yes passing gas, no Bowel Movement. Taking PO well. Denies SOB, Chest or Calf Pain. Using Incentive Spirometer, PAS in place. Ambulate Patient walked 30 feet yesterday Patient reports pain as 3 on 0-10 scale  .    Objective: Vital signs in last 24 hours: Filed Vitals:   04/21/14 0000 04/21/14 0122 04/21/14 0400 04/21/14 0616  BP:  124/59  120/70  Pulse:  107  100  Temp:  99.7 F (37.6 C)  98.5 F (36.9 C)  TempSrc:  Oral  Oral  Resp: 18  16   Height:      Weight:      SpO2:  100%  100%      Intake/Output from previous day: I/O last 3 completed shifts: In: 5967.5 [P.O.:830; I.V.:4637.5; IV Piggyback:500] Out: 3800 [Urine:3200; Blood:600]   Intake/Output this shift:     LABORATORY DATA:  Recent Labs  04/20/14 1800 04/21/14 0503  WBC  --  8.4  HGB 10.2* 9.9*  HCT 28.6* 28.0*  PLT  --  184  NA  --  136  K  --  4.1  CL  --  109  CO2  --  24  BUN  --  7  CREATININE  --  1.10  GLUCOSE  --  151*  CALCIUM  --  8.3*    Examination: Neurologically intact ABD soft Neurovascular intact Sensation intact distally Intact pulses distally Dorsiflexion/Plantar flexion intact Incision: dressing C/D/I No cellulitis present Compartment soft} XR AP&Lat of hip shows well placed\fixed THA  Assessment:   1 Day Post-Op Procedure(s) (LRB): TOTAL HIP ARTHROPLASTY BILATERAL (Bilateral) ADDITIONAL DIAGNOSIS:  Expected Acute Blood Loss Anemia,   Plan: PT/OT WBAT, THA  posterior precautions  DVT Prophylaxis: SCDx72 hrs, ASA 325 mg BID x 2 weeks  DISCHARGE PLAN: Skilled Nursing Facility/Rehab, [facility]  DISCHARGE NEEDS: HHPT, CPM, Walker and 3-in-1  comode seat

## 2014-04-21 NOTE — Progress Notes (Signed)
Physical Therapy Treatment Patient Details Name: Darryl Pacheco MRN: [id number] DOB: 25-Jan-1966 Today's Date: 04/21/2014    History of Present Illness Pt admitted 1/18 for elective bilat posterior THA. Pt with unremarkable PMH.    PT Comments    Pt progressing well despite increased bilat hip pain today compared to yesterday. Pt with most difficulty with transferring sit <--> stand due to posterior bil hip precautions. Pt con't to benefit from SNF upon d/c to achieve safe mod I function for safe transition home due to pt home alone during day.   Follow Up Recommendations  SNF;Supervision/Assistance - 24 hour     Equipment Recommendations  Rolling walker with 5" wheels    Recommendations for Other Services       Precautions / Restrictions Precautions Precautions: Posterior Hip Precaution Booklet Issued: Yes (comment) Precaution Comments: reviewed precautions Restrictions Weight Bearing Restrictions: Yes RLE Weight Bearing: Weight bearing as tolerated LLE Weight Bearing: Weight bearing as tolerated    Mobility  Bed Mobility Overal bed mobility: Needs Assistance Bed Mobility: Supine to Sit     Supine to sit: Min assist     General bed mobility comments: increased time, HOB elevated, use of bed rails, assist for LE management  Transfers Overall transfer level: Needs assistance Equipment used: Rolling walker (2 wheeled) Transfers: Sit to/from Stand Sit to Stand: Mod assist         General transfer comment: assist to achieve full upright posture due to increased soreness and having to adhere to post hip precautions  Ambulation/Gait Ambulation/Gait assistance: Min assist Ambulation Distance (Feet): 125 Feet Assistive device: Rolling walker (2 wheeled) Gait Pattern/deviations: Step-through pattern Gait velocity: slow/guarded   General Gait Details: guarded, inc bilat UE support, encouraged bilat hip/knee flexion during swing phase instead of  circumduction   Stairs            Wheelchair Mobility    Modified Rankin (Stroke Patients Only)       Balance Overall balance assessment: Needs assistance         Standing balance support: During functional activity Standing balance-Leahy Scale: Fair Standing balance comment: pt able to stand at sink and wash hands with min guard                    Cognition Arousal/Alertness: Awake/alert Behavior During Therapy: WFL for tasks assessed/performed Overall Cognitive Status: Within Functional Limits for tasks assessed                      Exercises Total Joint Exercises Quad Sets: AROM;Both;10 reps;Supine Heel Slides: AROM;Both;10 reps;Supine Hip ABduction/ADduction: AROM;Both;10 reps    General Comments        Pertinent Vitals/Pain Pain Assessment: 0-10 Pain Score: 7  Pain Location: bilat hips L >R Pain Descriptors / Indicators: Sore Pain Intervention(s): RN gave pain meds during session;Monitored during session    Home Living Family/patient expects to be discharged to:: Other (Comment) ([facility] for [facility]) Living Arrangements: Spouse/significant other                  Prior Function            PT Goals (current goals can now be found in the care plan section) Progress towards PT goals: Progressing toward goals    Frequency  7X/week    PT Plan Current plan remains appropriate    Co-evaluation             End of Session Equipment Utilized During  Treatment: Gait belt Activity Tolerance: Patient tolerated treatment well Patient left:  (on shower seat with OT)     Time: 3244-0102 PT Time Calculation (min) (ACUTE ONLY): 38 min  Charges:  $Gait Training: 23-37 mins $Therapeutic Exercise: 8-22 mins                    G Codes:      Marcene Brawn 04/21/2014, 9:58 AM   Lewis Shock, PT, DPT Pager #: [phone] Office #: [phone]

## 2014-04-22 LAB — URINALYSIS, ROUTINE W REFLEX MICROSCOPIC
Bilirubin Urine: NEGATIVE
Glucose, UA: NEGATIVE mg/dL
Ketones, ur: NEGATIVE mg/dL
Leukocytes, UA: NEGATIVE
NITRITE: NEGATIVE
PROTEIN: NEGATIVE mg/dL
Specific Gravity, Urine: 1.009 (ref 1.005–1.030)
UROBILINOGEN UA: 0.2 mg/dL (ref 0.0–1.0)
pH: 6 (ref 5.0–8.0)

## 2014-04-22 LAB — CBC
HEMATOCRIT: 28.6 % — AB (ref 39.0–52.0)
Hemoglobin: 9.9 g/dL — ABNORMAL LOW (ref 13.0–17.0)
MCH: 29.7 pg (ref 26.0–34.0)
MCHC: 34.6 g/dL (ref 30.0–36.0)
MCV: 85.9 fL (ref 78.0–100.0)
Platelets: 162 10*3/uL (ref 150–400)
RBC: 3.33 MIL/uL — ABNORMAL LOW (ref 4.22–5.81)
RDW: 12.8 % (ref 11.5–15.5)
WBC: 8.1 10*3/uL (ref 4.0–10.5)

## 2014-04-22 LAB — URINE MICROSCOPIC-ADD ON

## 2014-04-22 NOTE — Progress Notes (Signed)
Patient with temp of 101.6, HR 112, Tylenol given with positive effect.  Temp re-check of 99.0.  Dannielle BurnEric Phillips, PA aware, patient will not be d/c today.  Patient then with complaints of urethral pain.  Spoke with Dr. Turner Danielsowan, new order for UA/C+S.  Urine sent, results pending, nursing will continue to monitor.

## 2014-04-22 NOTE — Plan of Care (Signed)
Problem: Consults Goal: Diagnosis- Total Joint Replacement Primary Total Hip Bilateral      

## 2014-04-22 NOTE — Progress Notes (Signed)
04/22/14 PT recommended SNF. Referral made to CSW. Will continue to follow for d/c needs. 

## 2014-04-22 NOTE — Progress Notes (Signed)
Physical Therapy Treatment Patient Details Name: Darryl Pacheco Weich MRN: [id number] DOB: January 28, 1966 Today's Date: 04/22/2014    History of Present Illness Pt admitted 1/18 for elective bilat posterior THA. Pt with unremarkable PMH.    PT Comments    Pt with increased pain at this time limiting ambulation and exercise tolerance. Pt with c/o L stabbing pain on lateral hip. Pt con't to have swelling and limiting active hip flexion. Pt con't to have difficulty with sit to stand due to precautions. Pt to con't to benefit from SNF upon d/c to achieve safe mod I function for safe transition home.   Follow Up Recommendations  SNF;Supervision/Assistance - 24 hour     Equipment Recommendations  Rolling walker with 5" wheels    Recommendations for Other Services       Precautions / Restrictions Precautions Precautions: Posterior Hip Precaution Booklet Issued: Yes (comment) Precaution Comments: pt able to recall 3/3 Restrictions Weight Bearing Restrictions: Yes RLE Weight Bearing: Weight bearing as tolerated LLE Weight Bearing: Weight bearing as tolerated    Mobility  Bed Mobility Overal bed mobility: Needs Assistance Bed Mobility: Sit to Supine       Sit to supine: Mod assist;Min assist   General bed mobility comments: assist for LE managment back to bed  Transfers Overall transfer level: Needs assistance Equipment used: Rolling walker (2 wheeled) Transfers: Sit to/from Stand Sit to Stand: Min assist;Mod assist         General transfer comment: pt con't to have difficulty completing standing position due to hip precautions of no hip flexion > 90 deg.   Ambulation/Gait Ambulation/Gait assistance: Min guard Ambulation Distance (Feet): 150 Feet Assistive device: Rolling walker (2 wheeled) Gait Pattern/deviations: Step-through pattern;Decreased stride length Gait velocity: slow/guarded Gait velocity interpretation: Below normal speed for age/gender General Gait Details: pt  with increased bilat UE WBing due to increased pain this date. Pt encourage to amb with increased hip/knee flexion instead of "stiff legged". Pt improved by end of ambulation   Stairs            Wheelchair Mobility    Modified Rankin (Stroke Patients Only)       Balance           Standing balance support: During functional activity Standing balance-Leahy Scale: Fair Standing balance comment: pt able to stand and urinate without lob                    Cognition Arousal/Alertness: Awake/alert Behavior During Therapy: WFL for tasks assessed/performed Overall Cognitive Status: Within Functional Limits for tasks assessed                      Exercises Total Joint Exercises Ankle Circles/Pumps: AROM;Both;10 reps;Supine Quad Sets: AROM;Both;10 reps;Supine Gluteal Sets: AROM;Both;10 reps;Supine Heel Slides: AROM;Both;10 reps;Supine Hip ABduction/ADduction: AROM;Both;10 reps    General Comments        Pertinent Vitals/Pain Pain Assessment: 0-10 Pain Score: 8  Pain Location: bilat hips L >R Pain Descriptors / Indicators: Sharp;Throbbing;Sore Pain Intervention(s): Monitored during session;RN gave pain meds during session    Home Living                      Prior Function            PT Goals (current goals can now be found in the care plan section) Progress towards PT goals: Progressing toward goals    Frequency  7X/week    PT Plan Current plan  remains appropriate    Co-evaluation             End of Session Equipment Utilized During Treatment: Gait belt Activity Tolerance: Patient limited by pain Patient left: in bed;with call bell/phone within reach     Time: 1057-1140 PT Time Calculation (min) (ACUTE ONLY): 43 min  Charges:  $Gait Training: 23-37 mins $Therapeutic Exercise: 8-22 mins                    G Codes:      Marcene Brawn 04/22/2014, 1:53 PM   Lewis Shock, PT, DPT Pager #: [phone] Office #:  [phone]

## 2014-04-22 NOTE — Progress Notes (Signed)
PATIENT ID: Darryl Pacheco  MRN: [id number]  DOB/AGE:  [birth year] / 49 y.o.  2 Days Post-Op Procedure(s) (LRB): TOTAL HIP ARTHROPLASTY BILATERAL (Bilateral)    PROGRESS NOTE Subjective: Patient is alert, oriented, no Nausea, no Vomiting, yes passing gas, no Bowel Movement. Taking PO Well. Denies SOB, Chest or Calf Pain. Using Incentive Spirometer, PAS in [place]. Ambulate WBAT Patient reports pain as 3 on 0-10 scale  .    Objective: Vital signs in last 24 hours: Filed Vitals:   04/21/14 2044 04/21/14 2342 04/22/14 0611 04/22/14 0743  BP: 128/66  115/63   Pulse: 128  112   Temp: 102.9 F (39.4 C) 99.1 F (37.3 C) 100.8 F (38.2 C)   TempSrc: Oral Oral Oral   Resp: 18  119 18  Height:      Weight:      SpO2: 99%  100%       Intake/Output from previous day: I/O last 3 completed shifts: In: 2907.5 [P.O.:770; I.V.:2137.5] Out: 2150 [Urine:2150]   Intake/Output this shift:     LABORATORY DATA:  Recent Labs  04/21/14 0503 04/22/14 0522  WBC 8.4 8.1  HGB 9.9* 9.9*  HCT 28.0* 28.6*  PLT 184 162  NA 136  --   K 4.1  --   CL 109  --   CO2 24  --   BUN 7  --   CREATININE 1.10  --   GLUCOSE 151*  --   CALCIUM 8.3*  --     Examination: Neurologically intact Neurovascular intact Sensation intact distally Intact pulses distally Dorsiflexion/Plantar flexion intact Incision: dressing C/D/I No cellulitis present Compartment soft} XR AP&Lat of hip shows well placed\fixed THA  Assessment:   2 Days Post-Op Procedure(s) (LRB): TOTAL HIP ARTHROPLASTY BILATERAL (Bilateral) ADDITIONAL DIAGNOSIS:  Expected Acute Blood Loss Anemia,  Plan: PT/OT WBAT, THA  posterior precautions  DVT Prophylaxis: SCDx72 hrs, ASA 325 mg BID x 2 weeks  DISCHARGE PLAN: Skilled Nursing Facility/Rehab, Darryl Pacheco when bed available.  DISCHARGE NEEDS: HHPT, HHRN, Walker and 3-in-1 comode seat

## 2014-04-23 LAB — CBC
HEMATOCRIT: 26.4 % — AB (ref 39.0–52.0)
HEMOGLOBIN: 9.2 g/dL — AB (ref 13.0–17.0)
MCH: 29.9 pg (ref 26.0–34.0)
MCHC: 34.8 g/dL (ref 30.0–36.0)
MCV: 85.7 fL (ref 78.0–100.0)
PLATELETS: 167 10*3/uL (ref 150–400)
RBC: 3.08 MIL/uL — ABNORMAL LOW (ref 4.22–5.81)
RDW: 12.4 % (ref 11.5–15.5)
WBC: 7.7 10*3/uL (ref 4.0–10.5)

## 2014-04-23 MED ORDER — SULFAMETHOXAZOLE-TRIMETHOPRIM 800-160 MG PO TABS: 1.0000 | ORAL_TABLET | Freq: Two times a day (BID) | ORAL | Status: DC

## 2014-04-23 MED ORDER — SULFAMETHOXAZOLE-TRIMETHOPRIM 800-160 MG PO TABS
1.0000 | ORAL_TABLET | Freq: Two times a day (BID) | ORAL | Status: DC
  Administered 2014-04-23: 1 via ORAL
  Filled 2014-04-23 (×2): qty 1

## 2014-04-23 NOTE — Discharge Summary (Signed)
Patient ID: Darryl Pacheco MRN: [id number] DOB/AGE: 05/06/1965 49 y.o.  Admit date: 04/20/2014 Discharge date: 04/23/2014  Admission Diagnoses:  Principal Problem:   Bilateral hip joint arthritis Active Problems:   Status post bilateral total hip replacement   Discharge Diagnoses:  Same  Past Medical History  Diagnosis Date  . GERD (gastroesophageal reflux disease)   . Arthritis     OSTEO ARTHRITIS    Surgeries: Procedure(s): TOTAL HIP ARTHROPLASTY BILATERAL on 04/20/2014   Consultants:    Discharged Condition: Improved  Hospital Course: Darryl Pacheco is an 49 y.o. male who was admitted 04/20/2014 for operative treatment ofBilateral hip joint arthritis. Patient has severe unremitting pain that affects sleep, daily activities, and work/hobbies. After pre-op clearance the patient was taken to the operating room on 04/20/2014 and underwent  Procedure(s): TOTAL HIP ARTHROPLASTY BILATERAL.    Patient was given perioperative antibiotics: Anti-infectives    Start     Dose/Rate Route Frequency Ordered Stop   04/23/14 1000  sulfamethoxazole-trimethoprim (BACTRIM DS,SEPTRA DS) 800-160 MG per tablet 1 tablet    Comments:  History of prostatitis, UA showed 0-1 bacteria.   1 tablet Oral Every 12 hours 04/23/14 0906     04/23/14 0000  sulfamethoxazole-trimethoprim (BACTRIM DS,SEPTRA DS) 800-160 MG per tablet    Comments:  1 tablet twice a day for 2 weeks for prostatitis   1 tablet Oral 2 times daily 04/23/14 0905     04/19/14 1115  ceFAZolin (ANCEF) IVPB 2 g/50 mL premix  Status:  Discontinued     2 g100 mL/hr over 30 Minutes Intravenous On call to O.R. 04/19/14 1115 04/20/14 1246       Patient was given sequential compression devices, early ambulation, and chemoprophylaxis to prevent DVT. Patient noted some burning with urination yesterday 04/22/2014, urinalysis showed 0-1 bacteria. He is had a history of prostatitis in the past is been treated with Septra DS and has done well. He would like  a prescription of Septra prior to discharge today.  Patient benefited maximally from hospital stay and there were no complications.    Recent vital signs: Patient Vitals for the past 24 hrs:  BP Temp Temp src Pulse Resp SpO2  04/23/14 0550 (!) 105/59 mmHg 98.3 F (36.8 C) - 98 18 100 %  04/23/14 0000 126/61 mmHg 99.5 F (37.5 C) - (!) 118 18 98 %  04/22/14 2005 (!) 122/58 mmHg 99.7 F (37.6 C) - (!) 121 18 100 %  04/22/14 1830 122/67 mmHg 99.1 F (37.3 C) Oral (!) 106 18 100 %  04/22/14 1458 - 99 F (37.2 C) - - - -  04/22/14 1329 125/73 mmHg (!) 101.6 F (38.7 C) Oral (!) 112 18 99 %  04/22/14 1134 - - - - 18 -     Recent laboratory studies:  Recent Labs  04/21/14 0503 04/22/14 0522 04/23/14 0621  WBC 8.4 8.1 7.7  HGB 9.9* 9.9* 9.2*  HCT 28.0* 28.6* 26.4*  PLT 184 162 167  NA 136  --   --   K 4.1  --   --   CL 109  --   --   CO2 24  --   --   BUN 7  --   --   CREATININE 1.10  --   --   GLUCOSE 151*  --   --   CALCIUM 8.3*  --   --      Discharge Medications:     Medication List    STOP taking these medications  nabumetone 750 MG tablet  Commonly known as:  RELAFEN     traMADol 50 MG tablet  Commonly known as:  ULTRAM      TAKE these medications        aspirin EC 325 MG tablet  Take 1 tablet (325 mg total) by mouth 2 (two) times daily.     famotidine 10 MG tablet  Commonly known as:  PEPCID  Take 10 mg by mouth daily.     fexofenadine 180 MG tablet  Commonly known as:  ALLEGRA  Take 180 mg by mouth daily as needed for allergies or rhinitis.     loratadine 10 MG tablet  Commonly known as:  CLARITIN  Take 10 mg by mouth daily as needed for allergies.     methocarbamol 500 MG tablet  Commonly known as:  ROBAXIN  Take 1 tablet (500 mg total) by mouth 2 (two) times daily with a meal.     oxyCODONE-acetaminophen 5-325 MG per tablet  Commonly known as:  ROXICET  Take 1 tablet by mouth every 4 (four) hours as needed.      sulfamethoxazole-trimethoprim 800-160 MG per tablet  Commonly known as:  BACTRIM DS,SEPTRA DS  Take 1 tablet by mouth 2 (two) times daily.        Diagnostic Studies: Dg Chest 2 View  04/10/2014   CLINICAL DATA:  Preop bilateral total hip arthroplasties. Initial encounter.  EXAM: CHEST  2 VIEW  COMPARISON:  None.  FINDINGS: The heart size and mediastinal contours are within normal limits. Both lungs are clear. The visualized skeletal structures are unremarkable.  IMPRESSION: No active cardiopulmonary disease.   Electronically Signed   By: Elige KoHetal  Patel   On: 04/10/2014 10:15   Dg Pelvis Portable  04/20/2014   CLINICAL DATA:  Postop for bilateral hip replacements  EXAM: PORTABLE PELVIS 1-2 VIEWS  COMPARISON:  None.  FINDINGS: Two frontal views of the pelvis submitted. There is bilateral hip joint prosthesis in anatomic alignment. Postsurgical changes with periarticular soft tissue air noted bilaterally.  IMPRESSION: Bilateral hip prosthesis in anatomic alignment.   Electronically Signed   By: Natasha MeadLiviu  Pop M.D.   On: 04/20/2014 13:04    Disposition: Skilled nursing facility, [facility].      Discharge Instructions    Call MD / Call 911    Complete by:  As directed   If you experience chest pain or shortness of breath, CALL 911 and be transported to the hospital emergency room.  If you develope a fever above 101 F, pus (white drainage) or increased drainage or redness at the wound, or calf pain, call your surgeon's office.     Change dressing    Complete by:  As directed   You may remove dressings after 10 days     Constipation Prevention    Complete by:  As directed   Drink plenty of fluids.  Prune juice may be helpful.  You may use a stool softener, such as Colace (over the counter) 100 mg twice a day.  Use MiraLax (over the counter) for constipation as needed.     Diet - low sodium heart healthy    Complete by:  As directed      Driving restrictions    Complete by:  As directed   No  driving for 2 weeks     Follow the hip precautions as taught in Physical Therapy    Complete by:  As directed      Increase activity slowly  as tolerated    Complete by:  As directed            Follow-up Information    Follow up with Nestor Lewandowsky, MD In 2 weeks.   Specialty:  Orthopedic Surgery   Contact information:   [address] [place] [address] [phone]       Follow up with Nestor Lewandowsky, MD In 1 week.   Specialty:  Orthopedic Surgery   Contact information:   [address] [place] [address] [phone]        Signed: Nestor Lewandowsky 04/23/2014, 9:08 AM

## 2014-04-23 NOTE — Progress Notes (Signed)
Physical Therapy Treatment Patient Details Name: Darryl Pacheco MRN: [id number] DOB: June 18, 1965 Today's Date: 04/23/2014    History of Present Illness      PT Comments    Plan is for d/c to [facility] today.  Follow Up Recommendations  SNF;Supervision/Assistance - 24 hour     Equipment Recommendations  Rolling walker with 5" wheels    Recommendations for Other Services       Precautions / Restrictions Precautions Precautions: Posterior Hip Precaution Comments: Pt able to recall 3/3 precautions. Restrictions RLE Weight Bearing: Weight bearing as tolerated LLE Weight Bearing: Weight bearing as tolerated    Mobility  Bed Mobility         Supine to sit: Min assist     General bed mobility comments: assist with BLE  Transfers   Equipment used: Rolling walker (2 wheeled)   Sit to Stand: Min assist;From elevated surface            Ambulation/Gait Ambulation/Gait assistance: Min guard Ambulation Distance (Feet): 200 Feet Assistive device: Rolling walker (2 wheeled) Gait Pattern/deviations: Step-through pattern;Decreased stride length Gait velocity: slow/guarded   General Gait Details: verbal cues for heel strike and toe off   Stairs            Wheelchair Mobility    Modified Rankin (Stroke Patients Only)       Balance                                    Cognition Arousal/Alertness: Awake/alert Behavior During Therapy: WFL for tasks assessed/performed Overall Cognitive Status: Within Functional Limits for tasks assessed                      Exercises Total Joint Exercises Ankle Circles/Pumps: AROM;Both;5 reps Quad Sets: AROM;Both;5 reps Gluteal Sets: AROM;Both;5 reps Heel Slides: AROM;Both;5 reps    General Comments        Pertinent Vitals/Pain Pain Assessment: 0-10 Pain Score: 3  Pain Location: bilat hips Pain Intervention(s): Monitored during session;Repositioned    Home Living                       Prior Function            PT Goals (current goals can now be found in the care plan section) Progress towards PT goals: Progressing toward goals    Frequency  7X/week    PT Plan Current plan remains appropriate    Co-evaluation             End of Session Equipment Utilized During Treatment: Gait belt Activity Tolerance: Patient tolerated treatment well Patient left: in chair;with call bell/phone within reach     Time: [id number] PT Time Calculation (min) (ACUTE ONLY): 18 min  Charges:  $Gait Training: 8-22 mins                    G Codes:      Ilda FoilGarrow, Ahnaf Caponi Rene 04/23/2014, 12:21 PM

## 2014-04-23 NOTE — Progress Notes (Signed)
Occupational Therapy Treatment Patient Details Name: Darryl Pacheco MRN: [id number] DOB: [birth year] Today's Date: 04/23/2014    History of present illness Pt admitted 1/18 for elective bilat posterior THA. Pt with unremarkable PMH.   OT comments  Pt seen for acute OT treatment session. Pt progressing towards goals. AE and ADL education provided as detailed below. D/c plan to SNF remains appropriate.  Follow Up Recommendations  SNF    Equipment Recommendations       Recommendations for Other Services      Precautions / Restrictions Precautions Precautions: Posterior Hip Precaution Comments: Pt able to recall 3/3 precautions. Restrictions Weight Bearing Restrictions: Yes RLE Weight Bearing: Weight bearing as tolerated LLE Weight Bearing: Weight bearing as tolerated       Mobility Bed Mobility Overal bed mobility: Needs Assistance Bed Mobility: Sit to Supine     Supine to sit: Min assist Sit to supine: Min assist   General bed mobility comments: light assist with BLE  Transfers Overall transfer level: Needs assistance Equipment used: Rolling walker (2 wheeled) Transfers: Sit to/from Stand Sit to Stand: Min assist;From elevated surface         General transfer comment: pt demonstrating good technique    Balance             Standing balance-Leahy Scale: Fair Standing balance comment: able to let go of rw briefly to complete don/doff of LB dressing                   ADL Overall ADL's : Needs assistance/impaired     Grooming: Wash/dry face;Sitting           Upper Body Dressing : Set up;Sitting   Lower Body Dressing: Minimal assistance;With adaptive equipment;Adhering to hip precautions Lower Body Dressing Details (indicate cue type and reason): min A for balance in sit<>stand; educated o AE technique             Functional mobility during ADLs: Min guard;Rolling walker General ADL Comments: switched out reacher and long handled sponge for  longer version of both; provided elastic shoelaces      Vision                     Perception     Praxis      Cognition   Behavior During Therapy: [facility] for tasks assessed/performed Overall Cognitive Status: Within Functional Limits for tasks assessed                       Extremity/Trunk Assessment               Exercises Total Joint Exercises Ankle Circles/Pumps: AROM;Both;5 reps Quad Sets: AROM;Both;5 reps Gluteal Sets: AROM;Both;5 reps Heel Slides: AROM;Both;5 reps   Shoulder Instructions       General Comments      Pertinent Vitals/ Pain       Pain Assessment: 0-10 Pain Score: 2  Pain Location: B hips Pain Descriptors / Indicators: Aching;Cramping;Constant Pain Intervention(s): Monitored during session;Repositioned  Home Living                                          Prior Functioning/Environment              Frequency Min 2X/week     Progress Toward Goals  OT Goals(current goals can now be found in the care plan  section)  Progress towards OT goals: Progressing toward goals  Acute Rehab OT Goals Patient Stated Goal: rehab then home  Plan Discharge plan remains appropriate    Co-evaluation                 End of Session Equipment Utilized During Treatment: Rolling walker   Activity Tolerance Patient tolerated treatment well   Patient Left in bed;with call bell/phone within reach   Nurse Communication          Time: [id number] OT Time Calculation (min): 25 min  Charges: OT General Charges $OT Visit: 1 Procedure OT Treatments $Self Care/Home Management : 23-37 mins  Darryl Pacheco, Darryl Pacheco 04/23/2014, 1:34 PM

## 2014-04-23 NOTE — Clinical Social Work Placement (Signed)
Clinical Social Work Department CLINICAL SOCIAL WORK PLACEMENT NOTE 04/21/2014  Patient: Darryl Pacheco,Darryl Pacheco Account Number: [id number] Admit date: 04/20/2014  Clinical Social Worker: Hortencia PilarKIERRA WILEY, CLINICAL SOCIAL WORKER Date/time: 04/21/2014 12:19 PM  Clinical Social Work is seeking post-discharge placement for this patient at the following level of care: SKILLED NURSING (*CSW will update this form in Epic as items are completed)   04/21/2014 Patient/family provided with [facility] Department of Clinical Social Work's list of facilities offering this level of care within the geographic area requested by the patient (or if unable, by the patient's family).  04/21/2014 Patient/family informed of their freedom to choose among providers that offer the needed level of care, that participate in Medicare, Medicaid or managed care program needed by the patient, have an available bed and are willing to accept the patient.  04/21/2014 Patient/family informed of [facility]' ownership interest in [facility], as well as of the fact that they are under no obligation to receive care at this facility.  PASARR submitted to EDS on 04/21/2014 PASARR number received on 04/21/2014  FL2 transmitted to all facilities in geographic area requested by pt/family on 04/21/2014 FL2 transmitted to all facilities within larger geographic area on   Patient informed that his/her managed care company has contracts with or will negotiate with certain facilities, including the following:    Patient/family informed of bed offers received: 04/21/2014 [facility] Physician recommends and patient chooses bed at   [facility]  Patient to be transferred to facility by [facility] Patient and family notified of transfer on 04/23/2014 Name of family member notified: Patient notified regarding discharge.  The following physician request  were entered in Epic:   Additional Comments:   Kierra S. Wiley, BSW-Intern      Cosigned by: Rod MaeEmily S Batul Diego, LCSW at 04/21/2014 12:31 PM  Revision History     Date/Time User Provider Type Action   04/21/2014 12:31 PM Rod MaeEmily S Talha Iser, LCSW Social Worker Cosign   04/21/2014 12:27 PM Robb MatarKierra S Wiley, Student-SW Student-Social Worker Sign          Marcelline DeistEmily Brogen Duell, LCSWA [phone]) Licensed Clinical Social Worker Orthopedics [phone]([id number]) and Surgical [phone]([id number])

## 2014-04-23 NOTE — Progress Notes (Signed)
Patient ID: Darryl Pacheco, male   DOB: [birth year], 49 y.o.   MRN: [id number] PATIENT ID: Darryl Pacheco  MRN: [id number]  DOB/AGE:  [birth year] / 49 y.o.  3 Days Post-Op Procedure(s) (LRB): TOTAL HIP ARTHROPLASTY BILATERAL (Bilateral)    PROGRESS NOTE Subjective: Patient is alert, oriented, no Nausea, no Vomiting, yes passing gas, 1 Bowel Movement. Taking PO well. Patient reports decreased discomfort with urination, he has had prostatitis in the past that has been treated with Septra without difficulty. His UA came back showing and occasional bacteria and this was a clean-catch specimen. Denies SOB, Chest or Calf Pain. Using Incentive Spirometer, PAS in place. Ambulate greater than 250 feet Patient reports pain as 2 on 0-10 scale  .    Objective: Vital signs in last 24 hours: Filed Vitals:   04/22/14 1830 04/22/14 2005 04/23/14 0000 04/23/14 0550  BP: 122/67 122/58 126/61 105/59  Pulse: 106 121 118 98  Temp: 99.1 F (37.3 C) 99.7 F (37.6 C) 99.5 F (37.5 C) 98.3 F (36.8 C)  TempSrc: Oral     Resp: 18 18 18 18   Height:      Weight:      SpO2: 100% 100% 98% 100%      Intake/Output from previous day: I/O last 3 completed shifts: In: 940 [P.O.:940] Out: -    Intake/Output this shift:     LABORATORY DATA:  Recent Labs  04/21/14 0503 04/22/14 0522 04/23/14 0621  WBC 8.4 8.1 7.7  HGB 9.9* 9.9* 9.2*  HCT 28.0* 28.6* 26.4*  PLT 184 162 167  NA 136  --   --   K 4.1  --   --   CL 109  --   --   CO2 24  --   --   BUN 7  --   --   CREATININE 1.10  --   --   GLUCOSE 151*  --   --   CALCIUM 8.3*  --   --     Examination: Neurologically intact ABD soft Neurovascular intact Sensation intact distally Intact pulses distally Dorsiflexion/Plantar flexion intact Incision: dressing C/D/I No cellulitis present Compartment soft} XR AP&Lat of hip shows well placed\fixed B THA  Assessment:   3 Days Post-Op Procedure(s) (LRB): TOTAL HIP ARTHROPLASTY BILATERAL  (Bilateral) ADDITIONAL DIAGNOSIS:  Expected Acute Blood Loss Anemia, history of prostatitis in the past, UA 0-1 bacteria. Plan: PT/OT WBAT, THA  posterior precautions Septra DS 1 po BID x 14 day for presumed prostatitis DVT Prophylaxis: SCDx72 hrs, ASA 325 mg BID x 2 weeks  DISCHARGE PLAN: Skilled Nursing Facility/Rehab [name]  DISCHARGE NEEDS: HHPT, Walker and 3-in-1 comode seat

## 2014-04-23 NOTE — Clinical Social Work Note (Signed)
Patient to be discharged to [name]. Patient updated at bedside.  Facility: [name] Report number: [id number] Transportation: EMS ([facility])  Margeaux Swantek, LCSWA [phone]) Licensed Clinical Social Worker Orthopedics ([id number]) and Surgical ([id number])  

## 2014-04-23 NOTE — Progress Notes (Signed)
[facility] ,Clydie BraunKaren

## 2014-04-23 NOTE — Progress Notes (Signed)
No BM since 1/15 pt refused laxative

## 2014-04-23 NOTE — Discharge Instructions (Signed)
TOTAL HIP REPLACEMENT POSTOPERATIVE DIRECTIONS    Hip Rehabilitation, Guidelines Following Surgery  The results of a hip operation are greatly improved after range of motion and muscle strengthening exercises. Follow all safety measures which are given to protect your hip. If any of these exercises cause increased pain or swelling in your joint, decrease the amount until you are comfortable again. Then slowly increase the exercises. Call your caregiver if you have problems or questions.  HOME CARE INSTRUCTIONS  Most of the following instructions are designed to prevent the dislocation of your new hip.  Remove items at home which could result in a fall. This includes throw rugs or furniture in walking pathways.  Continue medications as instructed at time of discharge.  You may have some home medications which will be placed on hold until you complete the course of blood thinner medication.  You may start showering once you are discharged home but do not submerge the incision under water. Just pat the incision dry and apply a dry gauze dressing on daily. Do not put on socks or shoes without following the instructions of your caregivers.  Sit on high chairs so your hips are not bent more than 90 degrees.  Sit on chairs with arms. Use the chair arms to help push yourself up when arising.  Keep your leg on the side of the operation out in front of you when standing up.  Arrange for the use of a toilet seat elevator so you are not sitting low.  Do not do any exercises or get in any positions that cause your toes to point in (pigeon toed).  Always sleep with a pillow between your legs. Do not lie on your side in sleep with both knees touching the bed.   Walk with walker as instructed.  You may resume a sexual relationship in one month or when given the OK by your caregiver.  Use walker as long as suggested by your caregivers.  You may put full weight on your legs and walk as much as is  comfortable. Avoid periods of inactivity such as sitting longer than an hour when not asleep. This helps prevent blood clots.  You may return to work once you are cleared by Designer, industrial/productyour surgeon.  Do not drive a car for 6 weeks or until released by your surgeon.  Do not drive while taking narcotics.  Wear elastic stockings for three weeks following surgery during the day but you may remove then at night.  Make sure you keep all of your appointments after your operation with all of your doctors and caregivers. You should call the office at the above phone number and make an appointment for approximately two weeks after the date of your surgery. Change the dressing daily and reapply a dry dressing each time. Please pick up a stool softener and laxative for home use as long as you are requiring pain medications.  ICE to the affected hip every three hours for 30 minutes at a time and then as needed for pain and swelling. Continue to use ice on the hip for pain and swelling from surgery. You may notice swelling that will progress down to the foot and ankle.  This is normal after  surgery.  Elevate the leg when you are not up walking on it.   It is important for you to complete the blood thinner medication as prescribed by your doctor.  Continue to use the breathing machine which will help keep your temperature down.  It is common for your temperature to cycle up and down following surgery, especially at night when you are not up moving around and exerting yourself.  The breathing machine keeps your lungs expanded and your temperature down.  RANGE OF MOTION AND STRENGTHENING EXERCISES  These exercises are designed to help you keep full movement of your hip joint. Follow your caregiver's or physical therapist's instructions. Perform all exercises about fifteen times, three times per day or as directed. Exercise both hips, even if you have had only one joint replacement. These exercises can be done on a training  (exercise) mat, on the floor, on a table or on a bed. Use whatever works the best and is most comfortable for you. Use music or television while you are exercising so that the exercises are a pleasant break in your day. This will make your life better with the exercises acting as a break in routine you can look forward to.  Lying on your back, slowly slide your foot toward your buttocks, raising your knee up off the floor. Then slowly slide your foot back down until your leg is straight again.  Lying on your back spread your legs as far apart as you can without causing discomfort.  Lying on your side, raise your upper leg and foot straight up from the floor as far as is comfortable. Slowly lower the leg and repeat.  Lying on your back, tighten up the muscle in the front of your thigh (quadriceps muscles). You can do this by keeping your leg straight and trying to raise your heel off the floor. This helps strengthen the largest muscle supporting your knee.  Lying on your back, tighten up the muscles of your buttocks both with the legs straight and with the knee bent at a comfortable angle while keeping your heel on the floor.   SKILLED REHAB INSTRUCTIONS: If the patient is transferred to a skilled rehab facility following release from the hospital, a list of the current medications will be sent to the facility for the patient to continue.  When discharged from the skilled rehab facility, please have the facility set up the patient's Home Health Physical Therapy prior to being released. Also, the skilled facility will be responsible for providing the patient with their medications at time of release from the facility to include their pain medication, the muscle relaxants, and their blood thinner medication. If the patient is still at the rehab facility at time of the two week follow up appointment, the skilled rehab facility will also need to assist the patient in arranging follow up appointment in our office  and any transportation needs.  MAKE SURE YOU:  Understand these instructions.  Will watch your condition.  Will get help right away if you are not doing well or get worse.  Pick up stool softner and laxative for home. Do not submerge incision under water. May shower. Continue to use ice for pain and swelling from surgery. Hip precautions.  Total Hip Protocol.

## 2014-04-24 LAB — TYPE AND SCREEN
ABO/RH(D): A POS
ANTIBODY SCREEN: NEGATIVE
Unit division: 0
Unit division: 0

## 2014-04-24 LAB — URINE CULTURE
CULTURE: NO GROWTH
Colony Count: NO GROWTH

## 2014-04-27 ENCOUNTER — Encounter: Payer: Self-pay | Admitting: Registered Nurse

## 2014-04-27 ENCOUNTER — Non-Acute Institutional Stay (SKILLED_NURSING_FACILITY): Payer: 59 | Admitting: Registered Nurse

## 2014-04-27 DIAGNOSIS — D62 Acute posthemorrhagic anemia: Secondary | ICD-10-CM

## 2014-04-27 DIAGNOSIS — M160 Bilateral primary osteoarthritis of hip: Secondary | ICD-10-CM

## 2014-04-27 DIAGNOSIS — N3090 Cystitis, unspecified without hematuria: Secondary | ICD-10-CM

## 2014-04-27 DIAGNOSIS — J309 Allergic rhinitis, unspecified: Secondary | ICD-10-CM

## 2014-04-27 DIAGNOSIS — K219 Gastro-esophageal reflux disease without esophagitis: Secondary | ICD-10-CM

## 2014-04-27 NOTE — Progress Notes (Signed)
Patient ID: Darryl Pacheco, male   DOB: 1965/06/07, 49 y.o.   MRN: [id number]   [facility]  Allergies  Allergen Reactions  . Other     Aged Cheeses - cause stomach problems  . Penicillins     Childhood reactions    Code Status: Full Code  Goals of Care: Longevity/STR  Chief Complaint  Patient presents with  . Discharge Note    HPI 49 y.o. male with PMH of bilateral hip OA, GERD, AR, prostatitis is being seen for a discharge visit. Patient was here for short-term rehabilitation post hospital admission from 04/20/14 to 04/23/14 for bilateral hip replacement. Patient has worked with therapy team and [facility], and medications. Seen in room today. No complaints verbalized by patient.   Review of Systems Constitutional: Negative for fever, chills, and fatigue. HENT: Negative for ear pain, congestion, and sore throat Eyes: Negative for eye pain, eye discharge, and visual disturbance  Cardiovascular: Negative for chest pain, palpitations, and leg swelling Respiratory: Negative cough, shortness of breath, and wheezing.  Gastrointestinal: Negative for nausea and vomiting. Negative for abdominal pain, diarrhea and constipation.  Genitourinary: Negative for  dysuria, frequency, urgency, and hematuria Endocrine: Negative for polydipsia, polyphagia, and polyuria Musculoskeletal: Negative for back pain, joint pain, and joint swelling  Neurological: Negative for dizziness, headache, weakness, and tremors.  Skin: Negative for rash and wound.   Psychiatric: Negative for depression  Past Medical History  Diagnosis Date  . GERD (gastroesophageal reflux disease)   . Arthritis     OSTEO ARTHRITIS    Past Surgical History  Procedure Laterality Date  . Ankle surgery      right     1982   . Refractive surgery      bil     . Total hip arthroplasty Bilateral 04/20/2014    dr Turner Danielsrowan  . Total hip arthroplasty Bilateral 04/20/2014      Procedure: TOTAL HIP ARTHROPLASTY BILATERAL;  Surgeon: Nestor LewandowskyFrank J Rowan, MD;  Location: [facility] OR;  Service: Orthopedics;  Laterality: Bilateral;    History   Social History  . Marital Status: Single    Spouse Name: N/A    Number of Children: N/A  . Years of Education: N/A   Occupational History  . Not on file.   Social History Main Topics  . Smoking status: Never Smoker   . Smokeless tobacco: Never Used  . Alcohol Use: Yes     Comment: social     . Drug Use: No  . Sexual Activity: Not on file   Other Topics Concern  . Not on file   Social History Narrative    No family history on file.    Medication List       This list is accurate as of: 04/27/14  2:53 PM.  Always use your most recent med list.               aspirin EC 325 MG tablet  Take 1 tablet (325 mg total) by mouth 2 (two) times daily.     famotidine 10 MG tablet  Commonly known as:  PEPCID  Take 10 mg by mouth daily.     fexofenadine 180 MG tablet  Commonly known as:  ALLEGRA  Take 180 mg by mouth daily as needed for allergies or rhinitis.     loratadine 10 MG tablet  Commonly known as:  CLARITIN  Take 10 mg by mouth  daily as needed for allergies.     methocarbamol 500 MG tablet  Commonly known as:  ROBAXIN  Take 1 tablet (500 mg total) by mouth 2 (two) times daily with a meal.     oxyCODONE-acetaminophen 5-325 MG per tablet  Commonly known as:  ROXICET  Take 1 tablet by mouth every 4 (four) hours as needed.     sulfamethoxazole-trimethoprim 800-160 MG per tablet  Commonly known as:  BACTRIM DS,SEPTRA DS  Take 1 tablet by mouth 2 (two) times daily.        Physical Exam  BP 135/76 mmHg  Pulse 97  Temp(Src) 97.3 F (36.3 C)  Resp 20  Ht  (1.88 m)  Wt 207 lb (93.895 kg)  BMI 26.57 kg/m2  SpO2 99%  Constitutional: WDWN adult male in no acute distress. Conversant and pleasant HEENT: Normocephalic and atraumatic. PERRL. EOM intact. No icterus. Oral mucosa moist. Posterior pharynx  clear of any exudate or lesions.  Neck: Supple and nontender. No lymphadenopathy, masses, or thyromegaly. No JVD or carotid bruits. Cardiac: Normal S1, S2. RRR without appreciable murmurs, rubs, or gallops. Distal pulses intact. Trace dependent edema.  Lungs: No respiratory distress. Breath sounds clear bilaterally without rales, rhonchi, or wheezes. Abdomen: Audible bowel sounds in all quadrants. Soft, nontender, nondistended. Musculoskeletal: Normal range of motion. No joint erythema or tenderness. Skin: Warm and dry. No rash noted. No erythema. Bilateral hip surgical dressings in place Neurological: Alert and oriented to person, place, and time. No focal deficits.  Psychiatric: Judgment and insight adequate. Appropriate mood and affect.   Labs Reviewed  CBC Latest Ref Rng 04/23/2014 04/22/2014 04/21/2014  WBC 4.0 - 10.5 K/uL 7.7 8.1 8.4  Hemoglobin 13.0 - 17.0 g/dL 6.4(P) 3.2(R) 5.1(O)  Hematocrit 39.0 - 52.0 % 26.4(L) 28.6(L) 28.0(L)  Platelets 150 - 400 K/uL 167 162 184    CMP Latest Ref Rng 04/21/2014 04/10/2014  Glucose 70 - 99 mg/dL 841(Y) 98  BUN 6 - 23 mg/dL 7 12  Creatinine 6.06 - 1.35 mg/dL 3.01 6.01  Sodium 093 - 145 mmol/L 136 142  Potassium 3.5 - 5.1 mmol/L 4.1 4.3  Chloride 96 - 112 mEq/L 109 108  CO2 19 - 32 mmol/L 24 29  Calcium 8.4 - 10.5 mg/dL 8.3(L) 9.2   Assessment & Plan 1. Primary osteoarthritis of both hips S/p bilateral hip replacement. Pain is adequately controlled with current regimen. Continue percocet 5/325mg  every four hours as needed for pain with robaxin  daily for muscle spasms. Continue asa  twice daily for DVT prophylaxis until discontinued by ortho. F/u with orthopedics, Dr. Turner Daniels   2. Gastroesophageal reflux disease, esophagitis presence not specified Continue pepcid  daily   3. Allergic rhinitis, unspecified allergic rhinitis type No issues. Continue allegra  and claritin  as needed for allergies.   4. UTI Has hx of  prostatitis. Will discharge home with remaining of abx. Continue and complete remaining of Bactrim DS 800/160mg  twice daily. Encourage hydration.   5. Acute blood loss anemia Most likely post op. Most recent Hbg/hct 9.9/28. PCP to monitor h&h   Home health services: PT DME required:None PCP follow-up: schedule f/u appt with PCP w/in 1-2 weeks of discharge from SNF 30-day supply of prescription medications provided (#30 robaxin , #30 Percoce5 5/325mg )  Time: 35 minutes on care coordination  Family/Staff Communication Plan of care discussed with patient and nursing staff. Patient and nursing staff verbalized understanding and agree with plan of care. No additional questions or concerns reported.  Loura Back, MSN, AGNP-C [facility] [address] [place], [place] [address] [phone] [8am-5pm] After hours: [phone]

## 2015-11-07 DIAGNOSIS — N451 Epididymitis: Secondary | ICD-10-CM | POA: Diagnosis not present

## 2016-01-10 ENCOUNTER — Other Ambulatory Visit: Payer: Self-pay | Admitting: Nurse Practitioner

## 2016-01-10 ENCOUNTER — Ambulatory Visit
Admission: RE | Admit: 2016-01-10 | Discharge: 2016-01-10 | Disposition: A | Discharge status: Home / Self Care | Payer: BLUE CROSS/BLUE SHIELD | Source: Ambulatory Visit | Attending: Nurse Practitioner | Admitting: Nurse Practitioner

## 2016-01-10 DIAGNOSIS — S60352A Superficial foreign body of left thumb, initial encounter: Secondary | ICD-10-CM | POA: Diagnosis not present

## 2016-01-10 DIAGNOSIS — Z23 Encounter for immunization: Secondary | ICD-10-CM | POA: Diagnosis not present

## 2016-02-23 DIAGNOSIS — L821 Other seborrheic keratosis: Secondary | ICD-10-CM | POA: Diagnosis not present

## 2016-02-23 DIAGNOSIS — L814 Other melanin hyperpigmentation: Secondary | ICD-10-CM | POA: Diagnosis not present

## 2016-06-22 DIAGNOSIS — Z0000 Encounter for general adult medical examination without abnormal findings: Secondary | ICD-10-CM | POA: Diagnosis not present

## 2016-06-22 DIAGNOSIS — Z125 Encounter for screening for malignant neoplasm of prostate: Secondary | ICD-10-CM | POA: Diagnosis not present

## 2016-06-22 DIAGNOSIS — K219 Gastro-esophageal reflux disease without esophagitis: Secondary | ICD-10-CM | POA: Diagnosis not present

## 2016-06-22 DIAGNOSIS — J302 Other seasonal allergic rhinitis: Secondary | ICD-10-CM | POA: Diagnosis not present

## 2018-02-14 DIAGNOSIS — M25571 Pain in right ankle and joints of right foot: Secondary | ICD-10-CM | POA: Diagnosis not present

## 2018-04-29 DIAGNOSIS — D485 Neoplasm of uncertain behavior of skin: Secondary | ICD-10-CM | POA: Diagnosis not present

## 2018-04-29 DIAGNOSIS — L738 Other specified follicular disorders: Secondary | ICD-10-CM | POA: Diagnosis not present

## 2018-04-29 DIAGNOSIS — L718 Other rosacea: Secondary | ICD-10-CM | POA: Diagnosis not present

## 2018-04-29 DIAGNOSIS — L814 Other melanin hyperpigmentation: Secondary | ICD-10-CM | POA: Diagnosis not present

## 2018-05-31 ENCOUNTER — Ambulatory Visit
Admission: RE | Admit: 2018-05-31 | Discharge: 2018-05-31 | Disposition: A | Discharge status: Home / Self Care | Payer: BLUE CROSS/BLUE SHIELD | Source: Ambulatory Visit | Attending: Internal Medicine | Admitting: Internal Medicine

## 2018-05-31 ENCOUNTER — Other Ambulatory Visit: Payer: Self-pay | Admitting: Internal Medicine

## 2018-05-31 DIAGNOSIS — R059 Cough, unspecified: Secondary | ICD-10-CM

## 2018-05-31 DIAGNOSIS — R05 Cough: Secondary | ICD-10-CM

## 2018-05-31 DIAGNOSIS — M2550 Pain in unspecified joint: Secondary | ICD-10-CM | POA: Diagnosis not present

## 2018-05-31 DIAGNOSIS — R945 Abnormal results of liver function studies: Secondary | ICD-10-CM | POA: Diagnosis not present

## 2018-05-31 DIAGNOSIS — R5383 Other fatigue: Secondary | ICD-10-CM | POA: Diagnosis not present

## 2018-07-01 DIAGNOSIS — R490 Dysphonia: Secondary | ICD-10-CM | POA: Diagnosis not present

## 2018-09-23 DIAGNOSIS — Z0000 Encounter for general adult medical examination without abnormal findings: Secondary | ICD-10-CM | POA: Diagnosis not present

## 2018-09-23 DIAGNOSIS — Z125 Encounter for screening for malignant neoplasm of prostate: Secondary | ICD-10-CM | POA: Diagnosis not present

## 2018-09-23 DIAGNOSIS — J302 Other seasonal allergic rhinitis: Secondary | ICD-10-CM | POA: Diagnosis not present

## 2018-09-23 DIAGNOSIS — Z23 Encounter for immunization: Secondary | ICD-10-CM | POA: Diagnosis not present

## 2018-09-23 DIAGNOSIS — K219 Gastro-esophageal reflux disease without esophagitis: Secondary | ICD-10-CM | POA: Diagnosis not present

## 2018-09-23 DIAGNOSIS — R945 Abnormal results of liver function studies: Secondary | ICD-10-CM | POA: Diagnosis not present

## 2018-09-23 DIAGNOSIS — R05 Cough: Secondary | ICD-10-CM | POA: Diagnosis not present

## 2018-09-30 ENCOUNTER — Other Ambulatory Visit: Payer: Self-pay | Admitting: Internal Medicine

## 2018-09-30 DIAGNOSIS — R7989 Other specified abnormal findings of blood chemistry: Secondary | ICD-10-CM

## 2018-10-10 ENCOUNTER — Ambulatory Visit
Admission: RE | Admit: 2018-10-10 | Discharge: 2018-10-10 | Disposition: A | Discharge status: Home / Self Care | Payer: BC Managed Care – PPO | Source: Ambulatory Visit | Attending: Internal Medicine | Admitting: Internal Medicine

## 2018-10-10 DIAGNOSIS — K760 Fatty (change of) liver, not elsewhere classified: Secondary | ICD-10-CM | POA: Diagnosis not present

## 2018-10-10 DIAGNOSIS — R7989 Other specified abnormal findings of blood chemistry: Secondary | ICD-10-CM

## 2018-10-21 DIAGNOSIS — R05 Cough: Secondary | ICD-10-CM | POA: Diagnosis not present

## 2018-10-21 DIAGNOSIS — R945 Abnormal results of liver function studies: Secondary | ICD-10-CM | POA: Diagnosis not present

## 2018-10-31 DIAGNOSIS — Z87891 Personal history of nicotine dependence: Secondary | ICD-10-CM | POA: Diagnosis not present

## 2018-10-31 DIAGNOSIS — J343 Hypertrophy of nasal turbinates: Secondary | ICD-10-CM | POA: Diagnosis not present

## 2018-10-31 DIAGNOSIS — K219 Gastro-esophageal reflux disease without esophagitis: Secondary | ICD-10-CM | POA: Diagnosis not present

## 2019-01-30 DIAGNOSIS — R945 Abnormal results of liver function studies: Secondary | ICD-10-CM | POA: Diagnosis not present

## 2019-02-12 DIAGNOSIS — R7401 Elevation of levels of liver transaminase levels: Secondary | ICD-10-CM | POA: Diagnosis not present

## 2019-02-21 DIAGNOSIS — R490 Dysphonia: Secondary | ICD-10-CM | POA: Diagnosis not present

## 2019-02-21 DIAGNOSIS — K219 Gastro-esophageal reflux disease without esophagitis: Secondary | ICD-10-CM | POA: Diagnosis not present

## 2019-02-21 DIAGNOSIS — R945 Abnormal results of liver function studies: Secondary | ICD-10-CM | POA: Diagnosis not present

## 2019-04-10 DIAGNOSIS — R002 Palpitations: Secondary | ICD-10-CM | POA: Diagnosis not present

## 2019-04-10 DIAGNOSIS — R7989 Other specified abnormal findings of blood chemistry: Secondary | ICD-10-CM | POA: Diagnosis not present

## 2019-04-10 DIAGNOSIS — R945 Abnormal results of liver function studies: Secondary | ICD-10-CM | POA: Diagnosis not present

## 2019-09-23 DIAGNOSIS — S76912D Strain of unspecified muscles, fascia and tendons at thigh level, left thigh, subsequent encounter: Secondary | ICD-10-CM | POA: Diagnosis not present

## 2019-09-23 DIAGNOSIS — R262 Difficulty in walking, not elsewhere classified: Secondary | ICD-10-CM | POA: Diagnosis not present

## 2019-09-23 DIAGNOSIS — M25552 Pain in left hip: Secondary | ICD-10-CM | POA: Diagnosis not present

## 2019-09-23 DIAGNOSIS — M6281 Muscle weakness (generalized): Secondary | ICD-10-CM | POA: Diagnosis not present

## 2019-09-29 DIAGNOSIS — R262 Difficulty in walking, not elsewhere classified: Secondary | ICD-10-CM | POA: Diagnosis not present

## 2019-09-29 DIAGNOSIS — S76912D Strain of unspecified muscles, fascia and tendons at thigh level, left thigh, subsequent encounter: Secondary | ICD-10-CM | POA: Diagnosis not present

## 2019-09-29 DIAGNOSIS — M6281 Muscle weakness (generalized): Secondary | ICD-10-CM | POA: Diagnosis not present

## 2019-10-08 DIAGNOSIS — M6281 Muscle weakness (generalized): Secondary | ICD-10-CM | POA: Diagnosis not present

## 2019-10-08 DIAGNOSIS — R262 Difficulty in walking, not elsewhere classified: Secondary | ICD-10-CM | POA: Diagnosis not present

## 2019-10-08 DIAGNOSIS — S76912D Strain of unspecified muscles, fascia and tendons at thigh level, left thigh, subsequent encounter: Secondary | ICD-10-CM | POA: Diagnosis not present

## 2019-12-11 DIAGNOSIS — M7062 Trochanteric bursitis, left hip: Secondary | ICD-10-CM | POA: Diagnosis not present

## 2020-01-27 DIAGNOSIS — M7062 Trochanteric bursitis, left hip: Secondary | ICD-10-CM | POA: Diagnosis not present

## 2020-08-20 DIAGNOSIS — L245 Irritant contact dermatitis due to other chemical products: Secondary | ICD-10-CM | POA: Diagnosis not present

## 2020-09-20 DIAGNOSIS — Z125 Encounter for screening for malignant neoplasm of prostate: Secondary | ICD-10-CM | POA: Diagnosis not present

## 2020-09-20 DIAGNOSIS — R198 Other specified symptoms and signs involving the digestive system and abdomen: Secondary | ICD-10-CM | POA: Diagnosis not present

## 2020-09-20 DIAGNOSIS — K219 Gastro-esophageal reflux disease without esophagitis: Secondary | ICD-10-CM | POA: Diagnosis not present

## 2020-09-20 DIAGNOSIS — F5101 Primary insomnia: Secondary | ICD-10-CM | POA: Diagnosis not present

## 2020-09-20 DIAGNOSIS — K760 Fatty (change of) liver, not elsewhere classified: Secondary | ICD-10-CM | POA: Diagnosis not present

## 2020-09-20 DIAGNOSIS — Z0000 Encounter for general adult medical examination without abnormal findings: Secondary | ICD-10-CM | POA: Diagnosis not present

## 2020-11-04 DIAGNOSIS — H2513 Age-related nuclear cataract, bilateral: Secondary | ICD-10-CM | POA: Diagnosis not present

## 2020-12-23 DIAGNOSIS — H25813 Combined forms of age-related cataract, bilateral: Secondary | ICD-10-CM | POA: Diagnosis not present

## 2020-12-23 DIAGNOSIS — Z9889 Other specified postprocedural states: Secondary | ICD-10-CM | POA: Diagnosis not present

## 2020-12-31 DIAGNOSIS — Z0000 Encounter for general adult medical examination without abnormal findings: Secondary | ICD-10-CM | POA: Diagnosis not present

## 2021-02-02 DIAGNOSIS — B9689 Other specified bacterial agents as the cause of diseases classified elsewhere: Secondary | ICD-10-CM | POA: Diagnosis not present

## 2021-02-02 DIAGNOSIS — Z23 Encounter for immunization: Secondary | ICD-10-CM | POA: Diagnosis not present

## 2021-02-02 DIAGNOSIS — H109 Unspecified conjunctivitis: Secondary | ICD-10-CM | POA: Diagnosis not present

## 2021-02-02 DIAGNOSIS — J019 Acute sinusitis, unspecified: Secondary | ICD-10-CM | POA: Diagnosis not present

## 2021-02-04 DIAGNOSIS — H00012 Hordeolum externum right lower eyelid: Secondary | ICD-10-CM | POA: Diagnosis not present

## 2021-02-04 DIAGNOSIS — J019 Acute sinusitis, unspecified: Secondary | ICD-10-CM | POA: Diagnosis not present

## 2021-02-04 DIAGNOSIS — H1033 Unspecified acute conjunctivitis, bilateral: Secondary | ICD-10-CM | POA: Diagnosis not present

## 2021-04-06 DIAGNOSIS — H25813 Combined forms of age-related cataract, bilateral: Secondary | ICD-10-CM | POA: Diagnosis not present

## 2021-04-15 DIAGNOSIS — H2511 Age-related nuclear cataract, right eye: Secondary | ICD-10-CM | POA: Diagnosis not present

## 2021-04-15 DIAGNOSIS — H52221 Regular astigmatism, right eye: Secondary | ICD-10-CM | POA: Diagnosis not present

## 2021-04-15 DIAGNOSIS — H25811 Combined forms of age-related cataract, right eye: Secondary | ICD-10-CM | POA: Diagnosis not present

## 2021-05-26 IMAGING — US ULTRASOUND ABDOMEN LIMITED
1 series · 14 of 25 positions shown · non-contrast
Comparison: None.

CLINICAL DATA: Elevated liver function tests.

EXAM:
ULTRASOUND ABDOMEN LIMITED RIGHT UPPER QUADRANT

[Series 1: ultrasound abdomen limited · 0.22mm/px · 14 of 43 slices shown]
[im 1/43]
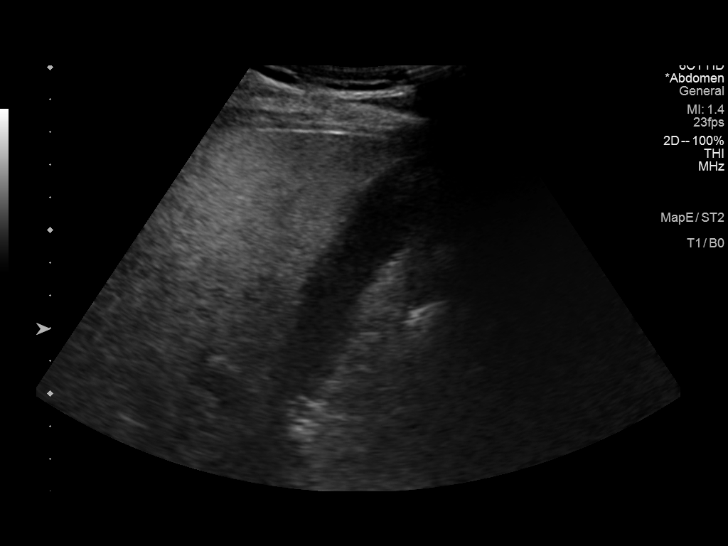
[im 4/43]
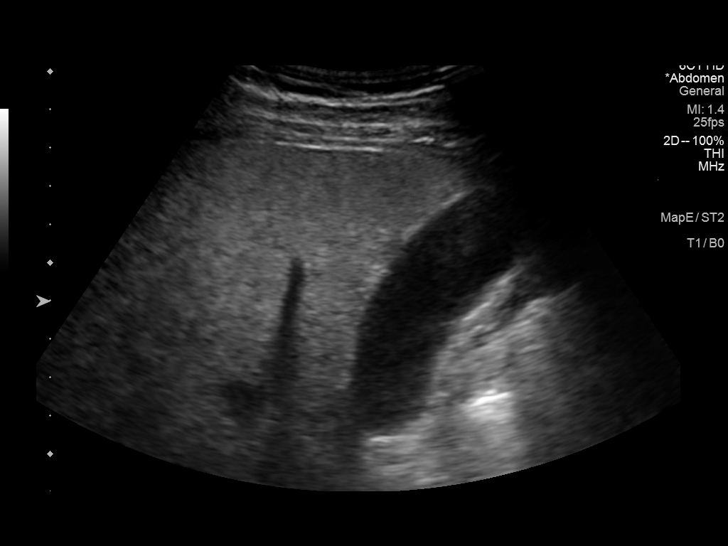
[im 8/43]
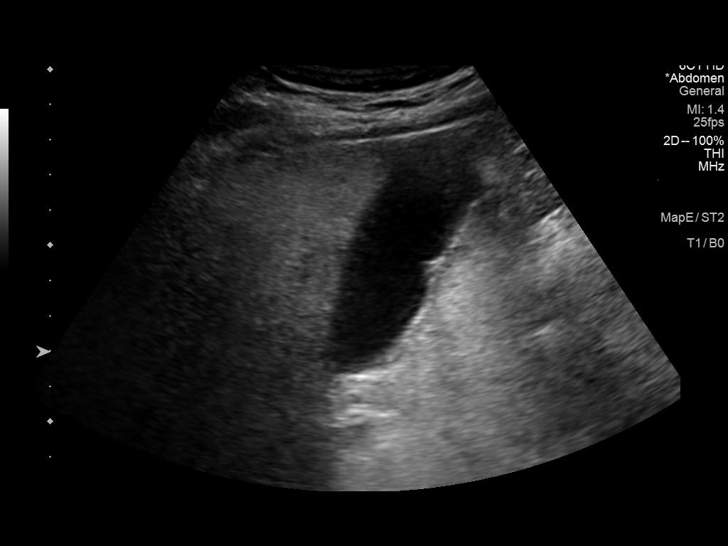
[im 11/43]
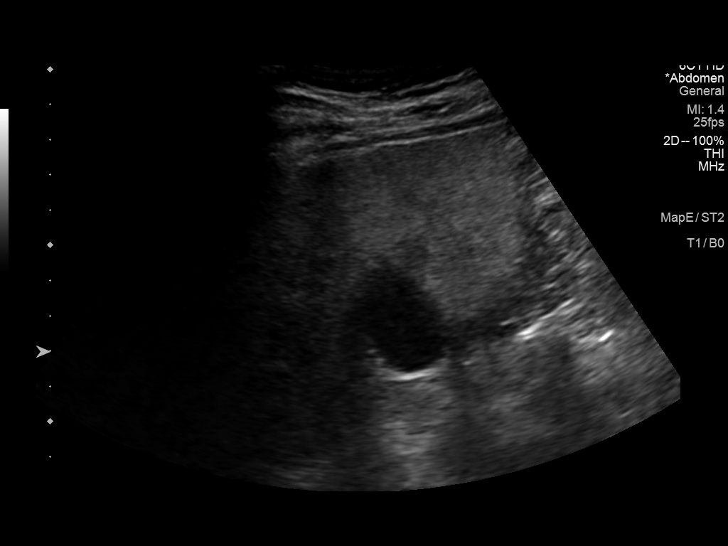
[im 15/43]
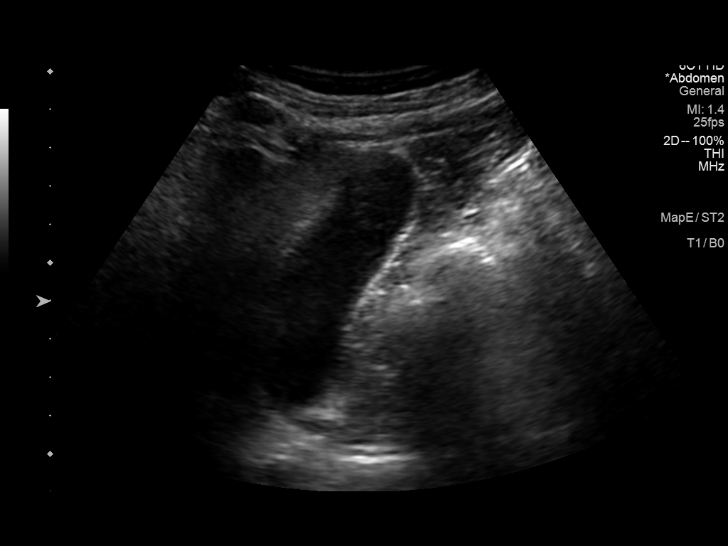
[im 16/43]
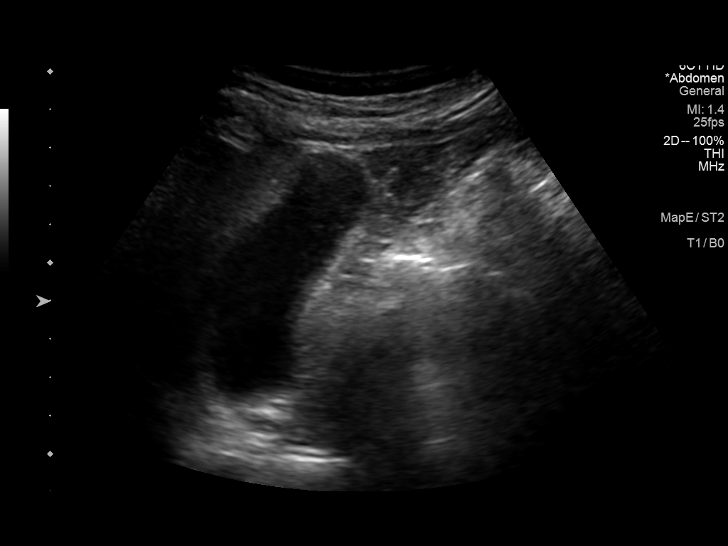
[im 20/43]
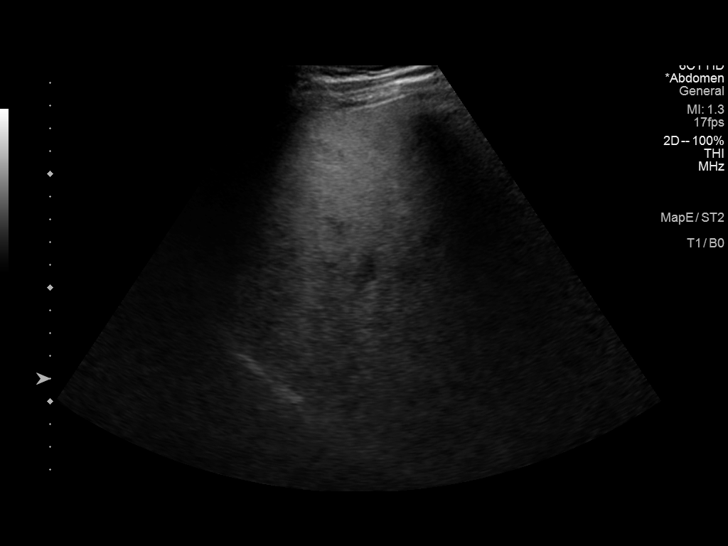
[im 23/43]
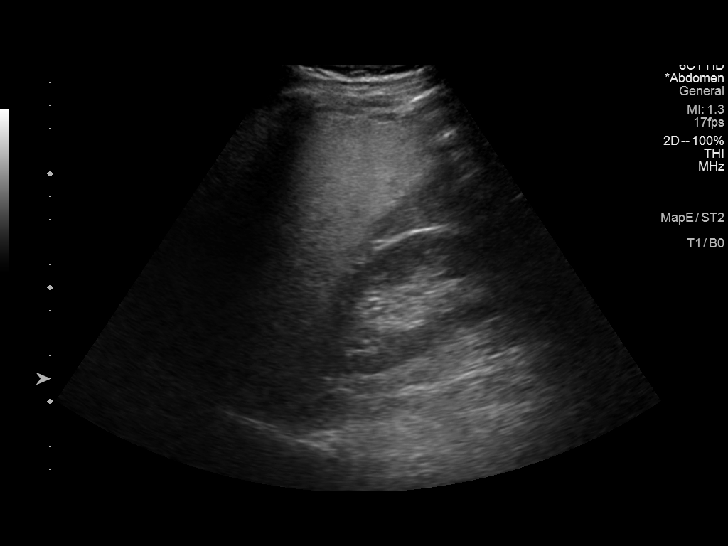
[im 27/43]
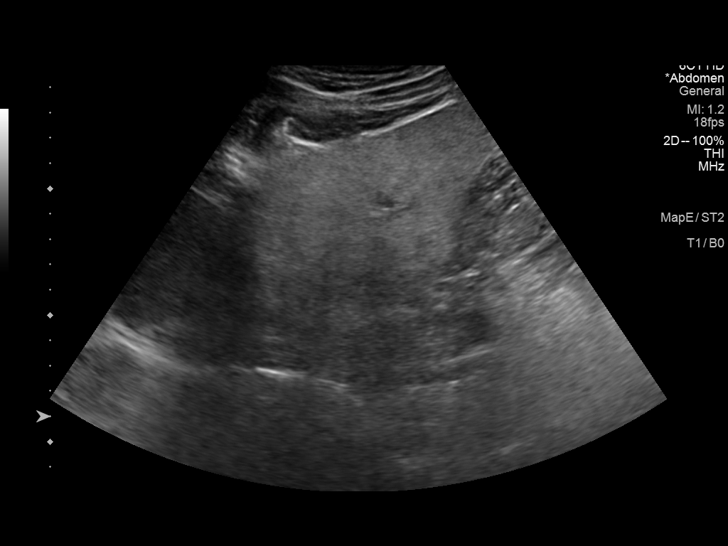
[im 29/43]
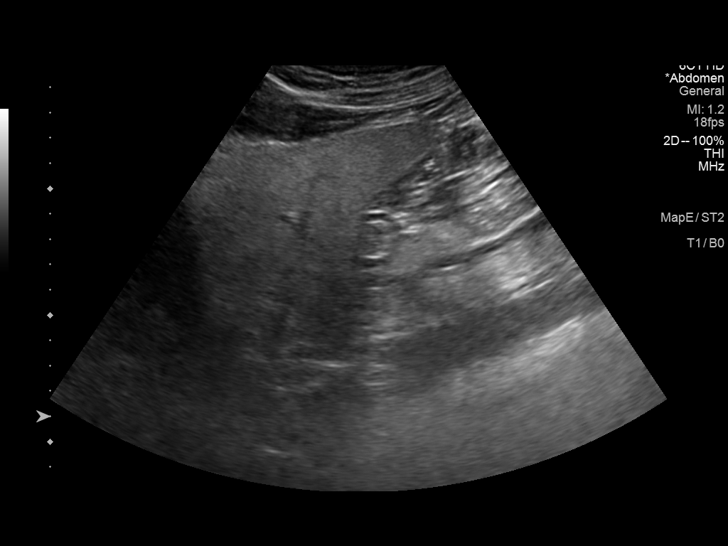
[im 32/43]
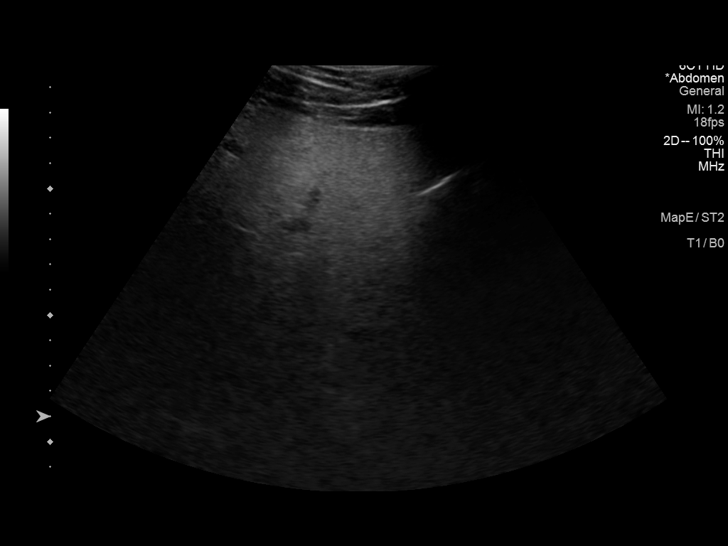
[im 36/43]
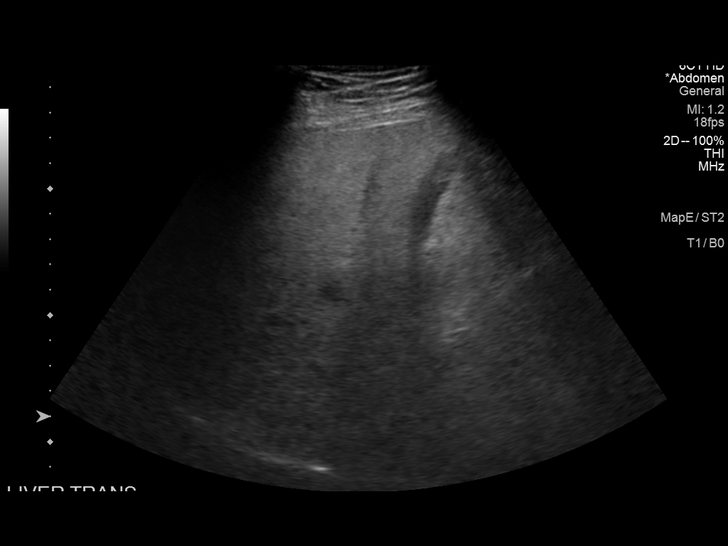
[im 39/43]
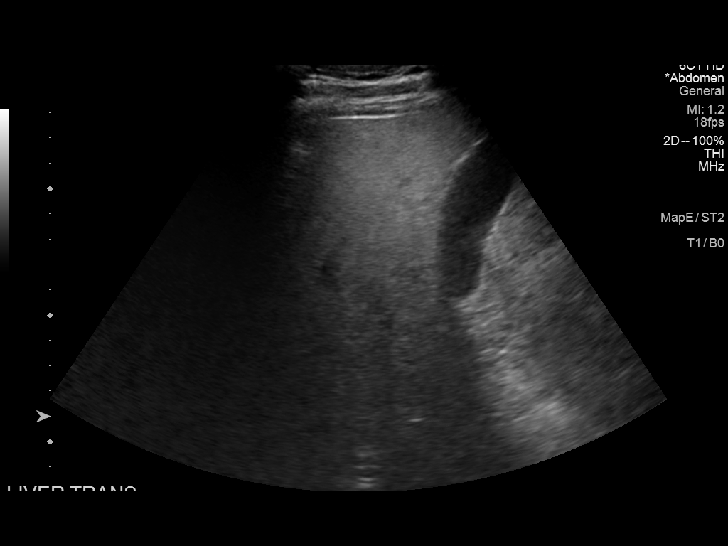
[im 43/43]
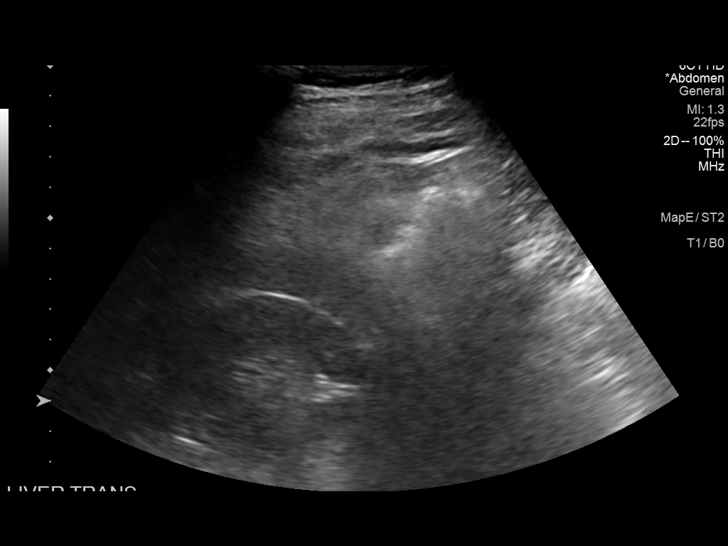

[14 of 25 positions shown; findings below may reference images not displayed]

FINDINGS: Gallbladder:

No gallstones or wall thickening visualized. No sonographic Murphy
sign noted by sonographer.

Common bile duct:

Diameter: 3 mm, within normal limits.

Liver:

Diffusely increased echogenicity of the hepatic parenchyma,
consistent with hepatic steatosis. No hepatic mass identified.
Portal vein is patent on color Doppler imaging with normal direction
of blood flow towards the liver.
IMPRESSION: Diffuse hepatic steatosis.  No evidence of hepatic mass.

No evidence of gallstones or biliary ductal dilatation.

## 2021-08-08 DIAGNOSIS — H25812 Combined forms of age-related cataract, left eye: Secondary | ICD-10-CM | POA: Diagnosis not present

## 2021-08-08 DIAGNOSIS — Z961 Presence of intraocular lens: Secondary | ICD-10-CM | POA: Diagnosis not present

## 2021-09-01 DIAGNOSIS — M25571 Pain in right ankle and joints of right foot: Secondary | ICD-10-CM | POA: Diagnosis not present

## 2021-09-20 ENCOUNTER — Other Ambulatory Visit: Payer: Self-pay | Admitting: Internal Medicine

## 2021-09-20 DIAGNOSIS — Z13220 Encounter for screening for lipoid disorders: Secondary | ICD-10-CM | POA: Diagnosis not present

## 2021-09-20 DIAGNOSIS — F5101 Primary insomnia: Secondary | ICD-10-CM | POA: Diagnosis not present

## 2021-09-20 DIAGNOSIS — Z0000 Encounter for general adult medical examination without abnormal findings: Secondary | ICD-10-CM | POA: Diagnosis not present

## 2021-09-20 DIAGNOSIS — R5383 Other fatigue: Secondary | ICD-10-CM | POA: Diagnosis not present

## 2021-09-20 DIAGNOSIS — Z87438 Personal history of other diseases of male genital organs: Secondary | ICD-10-CM | POA: Diagnosis not present

## 2021-09-20 DIAGNOSIS — Z8249 Family history of ischemic heart disease and other diseases of the circulatory system: Secondary | ICD-10-CM

## 2021-09-20 DIAGNOSIS — K219 Gastro-esophageal reflux disease without esophagitis: Secondary | ICD-10-CM | POA: Diagnosis not present

## 2021-09-23 DIAGNOSIS — Z0000 Encounter for general adult medical examination without abnormal findings: Secondary | ICD-10-CM | POA: Diagnosis not present

## 2021-09-30 DIAGNOSIS — M7918 Myalgia, other site: Secondary | ICD-10-CM | POA: Diagnosis not present

## 2021-09-30 DIAGNOSIS — M25511 Pain in right shoulder: Secondary | ICD-10-CM | POA: Diagnosis not present

## 2021-11-10 ENCOUNTER — Ambulatory Visit
Admission: RE | Admit: 2021-11-10 | Discharge: 2021-11-10 | Disposition: A | Discharge status: Home / Self Care | Payer: No Typology Code available for payment source | Source: Ambulatory Visit | Attending: Internal Medicine | Admitting: Internal Medicine

## 2021-11-10 DIAGNOSIS — Z8249 Family history of ischemic heart disease and other diseases of the circulatory system: Secondary | ICD-10-CM | POA: Diagnosis not present

## 2022-03-30 DIAGNOSIS — J329 Chronic sinusitis, unspecified: Secondary | ICD-10-CM | POA: Diagnosis not present

## 2022-09-25 DIAGNOSIS — M2550 Pain in unspecified joint: Secondary | ICD-10-CM | POA: Diagnosis not present

## 2022-09-25 DIAGNOSIS — R5383 Other fatigue: Secondary | ICD-10-CM | POA: Diagnosis not present

## 2022-09-25 DIAGNOSIS — Z0000 Encounter for general adult medical examination without abnormal findings: Secondary | ICD-10-CM | POA: Diagnosis not present

## 2022-09-25 DIAGNOSIS — Z79899 Other long term (current) drug therapy: Secondary | ICD-10-CM | POA: Diagnosis not present

## 2022-09-25 DIAGNOSIS — R197 Diarrhea, unspecified: Secondary | ICD-10-CM | POA: Diagnosis not present

## 2022-09-25 DIAGNOSIS — Z125 Encounter for screening for malignant neoplasm of prostate: Secondary | ICD-10-CM | POA: Diagnosis not present

## 2022-11-14 DIAGNOSIS — Z01818 Encounter for other preprocedural examination: Secondary | ICD-10-CM | POA: Diagnosis not present

## 2022-11-14 DIAGNOSIS — Z1211 Encounter for screening for malignant neoplasm of colon: Secondary | ICD-10-CM | POA: Diagnosis not present

## 2022-12-27 DIAGNOSIS — R197 Diarrhea, unspecified: Secondary | ICD-10-CM | POA: Diagnosis not present

## 2022-12-27 DIAGNOSIS — R5383 Other fatigue: Secondary | ICD-10-CM | POA: Diagnosis not present

## 2022-12-27 DIAGNOSIS — F5101 Primary insomnia: Secondary | ICD-10-CM | POA: Diagnosis not present

## 2022-12-27 DIAGNOSIS — K649 Unspecified hemorrhoids: Secondary | ICD-10-CM | POA: Diagnosis not present

## 2023-01-18 DIAGNOSIS — E291 Testicular hypofunction: Secondary | ICD-10-CM | POA: Diagnosis not present

## 2023-02-07 ENCOUNTER — Other Ambulatory Visit: Payer: Self-pay

## 2023-02-07 ENCOUNTER — Encounter: Payer: Self-pay | Admitting: Internal Medicine

## 2023-02-07 ENCOUNTER — Ambulatory Visit: Payer: BC Managed Care – PPO | Admitting: Internal Medicine

## 2023-02-07 VITALS — BP 110/64 | HR 79 | Temp 97.6°F | Resp 16 | Ht 75.0 in | Wt 211.9 lb

## 2023-02-07 DIAGNOSIS — J3089 Other allergic rhinitis: Secondary | ICD-10-CM

## 2023-02-07 DIAGNOSIS — J393 Upper respiratory tract hypersensitivity reaction, site unspecified: Secondary | ICD-10-CM

## 2023-02-07 MED ORDER — LORATADINE 10 MG PO TABS: 10.0000 mg | ORAL_TABLET | Freq: Every day | ORAL | 5 refills | Status: DC

## 2023-02-07 MED ORDER — FLUTICASONE PROPIONATE 50 MCG/ACT NA SUSP: 2.0000 | Freq: Every day | NASAL | 5 refills | Status: DC

## 2023-02-07 NOTE — Patient Instructions (Addendum)
Other Allergic Rhinitis: - Use nasal saline rinses before nose sprays such as with Neilmed Sinus Rinse.  Use distilled water.   - Use Flonase 1-2 sprays each nostril daily. Aim upward and outward. - Use Claritin 10 mg daily. Hold all anti histamines including Claritin 3 days prior to next visit.  Food Reactions - Will do testing for commonly allergenic foods    Follow up: 11/13 at 3 PM for skin testing.

## 2023-02-07 NOTE — Progress Notes (Addendum)
NEW PATIENT  Date of Service/Encounter:  02/07/23  Consult requested by: Emilio Aspen, MD   Subjective:   Darryl Pacheco (DOB: Oct 19, 1965) is a 57 y.o. male who presents to the clinic on 02/07/2023 with a chief complaint of Advice Only (Consult only//) .    History obtained from: chart review and patient.     Rhinitis:  Started since he was young. It has improved since he has gotten older.  Symptoms include: nasal congestion, rhinorrhea, and post nasal drainage   Occurs: Spring/Fall   Potential triggers: dust, pollen  Treatments tried:  Claritin daily   Previous allergy testing: yes History of sinus surgery: no Nonallergic triggers: none  Food Reactions:  Hard cheeses such as Parmesan cheese cause GI issues about an hour and has watery eyes, runny nose, stomach cramps, diarrhea and feels sinuses swelling.  Then felt fine after going to the bathroom.  Did not require any medications or Epi.    Decreased dairy significantly based on PCP advice- avoids fresh milk products like ice cream. Eats baked milk fine.    Chik fil a did not used to bother him but now gets GI cramps and diarrhea.  Some fried foods dont bother him though so its not always related to fried foods.  Worried about peanuts due to peanut oil.  Doesn't care to eat peanut much.    Doesn't like fresh eggs either.  Baked eggs are fine.  Has seen GI and is planning a colonoscopy at the beginning of the year.    Asthma Hx: Reports about 20 years ago, told he has EIB and given albuterol due to wheezing with exercise/running.  Denies any recent symptoms though.  Last use of albuterol was probably about 10+ years ago.  No oral prednisone either for asthma symptoms.  Past Medical History: Past Medical History:  Diagnosis Date   Arthritis    OSTEO ARTHRITIS   GERD (gastroesophageal reflux disease)     Birth History:  born at term without complications  Past Surgical History: Past Surgical History:   Procedure Laterality Date   ANKLE SURGERY     right     1982    REFRACTIVE SURGERY     bil      TOTAL HIP ARTHROPLASTY Bilateral 04/20/2014   dr Turner Daniels   TOTAL HIP ARTHROPLASTY Bilateral 04/20/2014   Procedure: TOTAL HIP ARTHROPLASTY BILATERAL;  Surgeon: Nestor Lewandowsky, MD;  Location: [facility] OR;  Service: Orthopedics;  Laterality: Bilateral;    Family History: History reviewed. No pertinent family history.  Medication List:  Allergies as of 02/07/2023       Reactions   Other    Aged Cheeses - cause stomach problems   Penicillins    Childhood reactions        Medication List        Accurate as of February 07, 2023  4:30 PM. If you have any questions, ask your nurse or doctor.          STOP taking these medications    aspirin EC 325 MG tablet Stopped by: Birder Robson   fexofenadine 180 MG tablet Commonly known as: ALLEGRA Stopped by: Birder Robson   methocarbamol 500 MG tablet Commonly known as: Robaxin Stopped by: Birder Robson   oxyCODONE-acetaminophen 5-325 MG tablet Commonly known as: Roxicet Stopped by: Birder Robson   sulfamethoxazole-trimethoprim 800-160 MG tablet Commonly known as: BACTRIM DS Stopped by: Birder Robson       TAKE  these medications    famotidine 40 MG tablet Commonly known as: PEPCID Take 40 mg by mouth 2 (two) times daily. What changed: Another medication with the same name was removed. Continue taking this medication, and follow the directions you see here. Changed by: Birder Robson   fluticasone 50 MCG/ACT nasal spray Commonly known as: FLONASE Place 1 spray into both nostrils daily as needed for allergies or rhinitis.   loratadine 10 MG tablet Commonly known as: CLARITIN Take 10 mg by mouth daily as needed for allergies.   PROBIOTIC-10 PO Take 1 tablet by mouth daily.   Vitamin D (Cholecalciferol) 25 MCG (1000 UT) Caps Take 1 capsule by mouth daily.         REVIEW OF SYSTEMS: Pertinent positives and negatives  discussed in HPI.   Objective:   Physical Exam: BP 110/64 (BP Location: Right Arm, Patient Position: Sitting, Cuff Size: Normal)   Pulse 79   Temp 97.6 F (36.4 C) (Temporal)   Resp 16   Ht 6\' 3"  (1.905 m)   Wt 211 lb 14.4 oz (96.1 kg)   SpO2 97%   BMI 26.49 kg/m  Body mass index is 26.49 kg/m. GEN: alert, well developed HEENT: clear conjunctiva, nose with + mild inferior turbinate hypertrophy, pink nasal mucosa, slight clear rhinorrhea, no cobblestoning HEART: regular rate and rhythm, no murmur LUNGS: clear to auscultation bilaterally, no coughing, unlabored respiration ABDOMEN: soft, non distended  SKIN: no rashes or lesions  Reviewed:  10/31/2022: seen by Dr Jenne Pane ENT for dysphonia, hoarseness.  Fiberoptic exam of pharynx/largynx was unremarkable.  12/28/2022: referred by PCP for allergic rhinitis and possible food allergies with GI symptoms.    Assessment:   1. Other allergic rhinitis   2. Upper respiratory tract hypersensitivity reaction     Plan/Recommendations:  Other Allergic Rhinitis: - Due to turbinate hypertrophy, seasonal symptoms and unresponsive to over the counter meds, will perform skin testing to identify aeroallergen triggers.   - Use nasal saline rinses before nose sprays such as with Neilmed Sinus Rinse.  Use distilled water.   - Use Flonase 1-2 sprays each nostril daily. Aim upward and outward. - Use Claritin 10 mg daily. Hold all anti histamines 3 days prior to next visit.  Food Reactions - Will do testing for commonly allergenic foods  - Initial rxn: GI symptoms with variety of foods and sometimes without a clear trigger.  Also had rhinorrhea/watery eyes with hard cheese ingestion in the past.  Hard cheese and chik fil a are common triggers.  - If testing is negative, discussed follow up with GI.  He already is planning on scheduling a colonoscopy.   Follow up: 11/13 at 3 PM for skin testing 1-68,   No follow-ups on file.  Alesia Morin,  MD [facility]

## 2023-02-14 ENCOUNTER — Ambulatory Visit: Payer: BC Managed Care – PPO | Admitting: Internal Medicine

## 2023-02-14 DIAGNOSIS — J393 Upper respiratory tract hypersensitivity reaction, site unspecified: Secondary | ICD-10-CM | POA: Diagnosis not present

## 2023-02-14 DIAGNOSIS — J3089 Other allergic rhinitis: Secondary | ICD-10-CM | POA: Diagnosis not present

## 2023-02-14 DIAGNOSIS — J301 Allergic rhinitis due to pollen: Secondary | ICD-10-CM | POA: Diagnosis not present

## 2023-02-14 NOTE — Patient Instructions (Addendum)
Allergic Rhinitis - Due to turbinate hypertrophy, seasonal symptoms and unresponsive to over the counter meds, will perform skin testing to identify aeroallergen triggers.  - SPT 02/2023: positive to trees, grasses, weeds, molds  - Use nasal saline rinses before nose sprays such as with Neilmed Sinus Rinse.  Use distilled water.   - Use Flonase 1-2 sprays each nostril daily. Aim upward and outward. - Use Claritin 10 mg daily.   Food Intolerance  - SPT 02/2023: negative to commonly allergenic foods. - Initial rxn: GI symptoms with variety of foods and sometimes without a clear trigger.  Also had rhinorrhea/watery eyes with hard cheese ingestion in the past but able to tolerate some other dairy products without those symptoms.  Hard cheese and chik fil a are common triggers.  - Discussed possibly some level of food intolerance.  If you feel better with dairy avoidance, continue to avoid or eat in small amounts as tolerate.  - Follow up with GI. Discussed possibility of IBS.

## 2023-02-14 NOTE — Progress Notes (Signed)
FOLLOW UP Date of Service/Encounter:  02/14/23   Subjective:  Darryl Pacheco (DOB: 1965-05-04) is a 57 y.o. male who [facility] for follow up for skin testing.   History obtained from: chart review and patient.  Anti histamines held.   Past Medical History: Past Medical History:  Diagnosis Date   Arthritis    OSTEO ARTHRITIS   GERD (gastroesophageal reflux disease)     Objective:  There were no vitals taken for this visit. There is no height or weight on file to calculate BMI. Physical Exam: GEN: alert, well developed HEENT: clear conjunctiva, MMM LUNGS: unlabored respiration   Skin Testing:  Skin prick testing was placed, which includes aeroallergens/foods, histamine control, and saline control.  Verbal consent was obtained prior to placing test.  Patient tolerated procedure well.  Allergy testing results were read and interpreted by myself, documented by clinical staff. Adequate positive and negative control.  Positive results to:  Results discussed with patient/family.  Airborne Adult Perc - 02/14/23 1500     Time Antigen Placed 1502    Allergen Manufacturer Waynette Buttery    Location Back    Number of Test 55    1. Control-Buffer 50% Glycerol Negative    2. Control-Histamine 3+    3. Bahia 2+    4. French Southern [place] Negative    5. Johnson 2+    6. Kentucky Blue 2+    7. Meadow Fescue 2+    8. Perennial Rye 2+    9. Timothy Negative    10. Ragweed Mix 3+    11. Cocklebur 2+    12. Plantain,  English Negative    13. Baccharis Negative    14. Dog Fennel Negative    15. Russian Thistle Negative    16. Lamb's Quarters Negative    17. Sheep Sorrell 2+    18. Rough Pigweed Negative    19. Marsh Elder, Rough Negative    20. Mugwort, Common 2+    21. Box, Elder 3+    22. Cedar, red Negative    23. Sweet Gum 2+    24. Pecan Pollen Negative    25. Pine Mix Negative    26. Walnut, Black Pollen 3+    27. Red Mulberry Negative    28.  Ash Mix 3+    29. Birch Mix Negative    30. Beech American Negative    31. Cottonwood, Guinea[place]issau Negative    32. Hickory, White Negative    33. Maple Mix Negative    34. Oak, [place] Mix 3+    35. Sycamore Eastern Negative    36. Alternaria Alternata 3+    37. Cladosporium Herbarum Negative    38. Aspergillus Mix Negative    39. Penicillium Mix 3+    40. Bipolaris Sorokiniana (Helminthosporium) 2+    41. Drechslera Spicifera (Curvularia) Negative    42. Mucor Plumbeus Negative    43. Fusarium Moniliforme Negative    44. Aureobasidium Pullulans (pullulara) Negative    45. Rhizopus Oryzae Negative    46. Botrytis Cinera Negative    47. Epicoccum Nigrum Negative    48. Phoma Betae 3+    49. Dust Mite Mix Negative    50. Cat Hair 10,000 BAU/ml Negative    51.  Dog Epithelia Negative    52. Mixed Feathers Negative    53. Horse Epithelia Negative    54. Cockroach, German Negative    55. Tobacco Leaf Negative  13 Food Perc - 02/14/23 1502       Test Information   Time Antigen Placed 1503    Allergen Manufacturer Waynette Buttery    Location Back    Number of allergen test 13    Food Select      Food   1. Peanut Negative    2. Soybean Negative    3. Wheat Negative    4. Sesame Negative    5. Milk, Cow Negative    6. Casein Negative    7. Egg White, Chicken Negative    8. Shellfish Mix Negative    9. Fish Mix Negative    10. Cashew Negative    11. Walnut Food Negative    12. Almond Negative    13. Hazelnut Negative              Assessment:   1. Seasonal allergic rhinitis due to pollen   2. Allergic rhinitis caused by mold   3. Upper respiratory tract hypersensitivity reaction     Plan/Recommendations:  Allergic Rhinitis - Due to turbinate hypertrophy, seasonal symptoms and unresponsive to over the counter meds, will perform skin testing to identify aeroallergen triggers.  - SPT 02/2023: positive to trees, grasses, weeds, molds  - Use nasal saline  rinses before nose sprays such as with Neilmed Sinus Rinse.  Use distilled water.   - Use Flonase 1-2 sprays each nostril daily. Aim upward and outward. - Use Claritin 10 mg daily.   Food Intolerance  - SPT 02/2023: negative to commonly allergenic foods. - Initial rxn: GI symptoms with variety of foods and sometimes without a clear trigger.  Also had rhinorrhea/watery eyes with hard cheese ingestion in the past but able to tolerate some other dairy products without those symptoms.  Hard cheese and chik fil a are common triggers.  - Discussed possibly some level of food intolerance.  If you feel better with dairy avoidance, continue to avoid or eat in small amounts as tolerate.  - Follow up with GI. Discussed possibility of IBS in terms of the GI symptoms with food ingestion.      Return in about 4 months (around 06/14/2023).  Alesia Morin, MD [facility]

## 2023-04-27 DIAGNOSIS — Z83719 Family history of colon polyps, unspecified: Secondary | ICD-10-CM | POA: Diagnosis not present

## 2023-04-27 DIAGNOSIS — D125 Benign neoplasm of sigmoid colon: Secondary | ICD-10-CM | POA: Diagnosis not present

## 2023-04-27 DIAGNOSIS — D122 Benign neoplasm of ascending colon: Secondary | ICD-10-CM | POA: Diagnosis not present

## 2023-04-27 DIAGNOSIS — Z1211 Encounter for screening for malignant neoplasm of colon: Secondary | ICD-10-CM | POA: Diagnosis not present

## 2023-04-30 DIAGNOSIS — K5730 Diverticulosis of large intestine without perforation or abscess without bleeding: Secondary | ICD-10-CM | POA: Diagnosis not present

## 2023-04-30 DIAGNOSIS — Z1211 Encounter for screening for malignant neoplasm of colon: Secondary | ICD-10-CM | POA: Diagnosis not present

## 2023-04-30 DIAGNOSIS — K648 Other hemorrhoids: Secondary | ICD-10-CM | POA: Diagnosis not present

## 2023-04-30 DIAGNOSIS — Z83719 Family history of colon polyps, unspecified: Secondary | ICD-10-CM | POA: Diagnosis not present

## 2023-04-30 DIAGNOSIS — D125 Benign neoplasm of sigmoid colon: Secondary | ICD-10-CM | POA: Diagnosis not present

## 2023-04-30 DIAGNOSIS — D122 Benign neoplasm of ascending colon: Secondary | ICD-10-CM | POA: Diagnosis not present

## 2023-06-12 ENCOUNTER — Ambulatory Visit: Payer: BC Managed Care – PPO | Admitting: Internal Medicine

## 2023-06-12 ENCOUNTER — Encounter: Payer: Self-pay | Admitting: Internal Medicine

## 2023-06-12 ENCOUNTER — Other Ambulatory Visit: Payer: Self-pay

## 2023-06-12 VITALS — BP 110/66 | HR 86 | Temp 98.7°F | Wt 215.5 lb

## 2023-06-12 DIAGNOSIS — J302 Other seasonal allergic rhinitis: Secondary | ICD-10-CM

## 2023-06-12 DIAGNOSIS — R197 Diarrhea, unspecified: Secondary | ICD-10-CM

## 2023-06-12 DIAGNOSIS — R1084 Generalized abdominal pain: Secondary | ICD-10-CM

## 2023-06-12 DIAGNOSIS — J3089 Other allergic rhinitis: Secondary | ICD-10-CM

## 2023-06-12 MED ORDER — AZELASTINE HCL 0.1 % NA SOLN: 2.0000 | Freq: Two times a day (BID) | NASAL | 5 refills | Status: DC | PRN

## 2023-06-12 MED ORDER — LORATADINE 10 MG PO TABS: 10.0000 mg | ORAL_TABLET | Freq: Every day | ORAL | 5 refills | Status: DC

## 2023-06-12 NOTE — Progress Notes (Signed)
   FOLLOW UP Date of Service/Encounter:  06/12/23   Subjective:  Darryl Pacheco (DOB: 1965/07/04) is a 58 y.o. male who [facility] for follow up for allergic rhinitis.  Also with food intolerance/GI issues.   History obtained from: chart review and patient. Last visit was with me on 02/14/2023 and was SPT negative to commonly allergenic foods.  Discussed GI evaluation, possibly IBS. Also with allergic rhinitis on Flonase, Claritin.   Since last visit, reports still having frequent episodes of loose stools and stomach pain/cramping.  Mostly avoiding dairy.  On and off symptoms so it is hard for him to pinpoint foods.  Saw GI- [facility] Dr Dulce Sellar and underwent colonoscopy with polyp removal that were benign; he still has not heard from them regarding what to do next.    Some increased congestion, drainage, sneezing.  Flonase is not helping and has worsened his symptoms so he stopped it.  Claritin does help but does not control it completely.   Past Medical History: Past Medical History:  Diagnosis Date   Arthritis    OSTEO ARTHRITIS   GERD (gastroesophageal reflux disease)     Objective:  BP 110/66 (BP Location: Left Arm, Patient Position: Sitting, Cuff Size: Large)   Pulse 86   Temp 98.7 F (37.1 C) (Temporal)   Wt 215 lb 8 oz (97.8 kg)   SpO2 96%   BMI 26.94 kg/m  Body mass index is 26.94 kg/m. Physical Exam: GEN: alert, well developed HEENT: clear conjunctiva, nose with mild inferior turbinate hypertrophy, pink nasal mucosa, + clear rhinorrhea, no cobblestoning HEART: regular rate and rhythm, no murmur LUNGS: clear to auscultation bilaterally, no coughing, unlabored respiration SKIN: no rashes or lesions  Assessment:   1. Seasonal and perennial allergic rhinitis   2. Diarrhea, unspecified type   3. Generalized abdominal pain     Plan/Recommendations:  Allergic Rhinitis - Improved  - SPT 02/2023: positive to trees, grasses,  weeds, molds  - Use nasal saline rinses before nose sprays such as with Neilmed Sinus Rinse.  Use distilled water.   - Use Azelastine 2 sprays each nostril twice daily with congestion, drainage, sneezing, runny nose.  Aim upward and outward.  - Use Claritin 10 mg daily.  - Consider allergy shots as long term control of your symptoms by teaching your immune system to be more tolerant of your allergy triggers  Possibly IBS  - SPT 02/2023: negative to commonly allergenic foods. - Initial rxn: GI symptoms with variety of foods and sometimes without a clear trigger.  Also had rhinorrhea/watery eyes with hard cheese ingestion in the past but able to tolerate some other dairy products without those symptoms.  Hard cheese and chik fil a are common triggers.  - Follow up with GI. Discussed possibility of IBS.  Consider low FODMAP diet.     Return in about 6 months (around 12/13/2023).  Alesia Morin, MD [facility]

## 2023-06-12 NOTE — Patient Instructions (Addendum)
 Allergic Rhinitis - SPT 02/2023: positive to trees, grasses, weeds, molds  - Use nasal saline rinses before nose sprays such as with Neilmed Sinus Rinse.  Use distilled water.   - Use Azelastine 2 sprays each nostril twice daily with congestion, drainage, sneezing, runny nose.  Aim upward and outward.  - Use Claritin 10 mg daily.  - Consider allergy shots as long term control of your symptoms by teaching your immune system to be more tolerant of your allergy triggers   Food Intolerance  - Follow up with GI. Discussed possibility of IBS.  Follow a low FODMAP diet.

## 2023-06-13 ENCOUNTER — Telehealth: Payer: Self-pay | Admitting: Internal Medicine

## 2023-06-13 MED ORDER — AZELASTINE HCL 0.1 % NA SOLN: 2.0000 | Freq: Two times a day (BID) | NASAL | 5 refills | Status: DC | PRN

## 2023-06-13 MED ORDER — LORATADINE 10 MG PO TABS: 10.0000 mg | ORAL_TABLET | Freq: Every day | ORAL | 5 refills | Status: AC

## 2023-06-13 NOTE — Telephone Encounter (Signed)
 Darryl Pacheco called in and states that his two prescriptions of Claritin and Astelin were sent to the wrong pharmacy.  He would like those called in to [facility] on [address]

## 2023-06-13 NOTE — Telephone Encounter (Signed)
 Resending Claritin and Azelastine (Astelin) 0.1% to correct pharmacy - [facility].

## 2023-06-21 NOTE — Telephone Encounter (Signed)
 Patient called and stated the [facility] on [address] did not received his prescription for the Claritin and Azelastine (Astelin) 0.1%.  Patient was asking if there is any way to send in the prescription again.  Patient would like a call back if possible.  Best Contact: [phone]

## 2023-06-22 MED ORDER — AZELASTINE HCL 0.1 % NA SOLN: 2.0000 | Freq: Two times a day (BID) | NASAL | 5 refills | Status: AC | PRN

## 2023-06-22 NOTE — Addendum Note (Signed)
 Addended by: Deborra Medina on: 06/22/2023 12:12 PM   Modules accepted: Orders

## 2023-06-22 NOTE — Telephone Encounter (Signed)
 I spoke with the pharmacist and they do have the Claritin  rx on file  and will get it ready for patient. Azelastine nasal spray was resent and patient was informed.

## 2023-06-26 DIAGNOSIS — R197 Diarrhea, unspecified: Secondary | ICD-10-CM | POA: Diagnosis not present

## 2023-06-29 DIAGNOSIS — R197 Diarrhea, unspecified: Secondary | ICD-10-CM | POA: Diagnosis not present

## 2023-08-02 DIAGNOSIS — M79672 Pain in left foot: Secondary | ICD-10-CM | POA: Diagnosis not present

## 2023-08-02 DIAGNOSIS — M79671 Pain in right foot: Secondary | ICD-10-CM | POA: Diagnosis not present

## 2023-09-12 DIAGNOSIS — Z0000 Encounter for general adult medical examination without abnormal findings: Secondary | ICD-10-CM | POA: Diagnosis not present

## 2023-09-12 DIAGNOSIS — Z789 Other specified health status: Secondary | ICD-10-CM | POA: Diagnosis not present

## 2023-09-12 DIAGNOSIS — Z79899 Other long term (current) drug therapy: Secondary | ICD-10-CM | POA: Diagnosis not present

## 2023-09-12 DIAGNOSIS — F5101 Primary insomnia: Secondary | ICD-10-CM | POA: Diagnosis not present

## 2023-09-12 DIAGNOSIS — Z125 Encounter for screening for malignant neoplasm of prostate: Secondary | ICD-10-CM | POA: Diagnosis not present

## 2023-09-12 DIAGNOSIS — K219 Gastro-esophageal reflux disease without esophagitis: Secondary | ICD-10-CM | POA: Diagnosis not present

## 2023-09-12 DIAGNOSIS — R197 Diarrhea, unspecified: Secondary | ICD-10-CM | POA: Diagnosis not present

## 2023-09-17 DIAGNOSIS — R197 Diarrhea, unspecified: Secondary | ICD-10-CM | POA: Diagnosis not present

## 2023-09-17 DIAGNOSIS — A040 Enteropathogenic Escherichia coli infection: Secondary | ICD-10-CM | POA: Diagnosis not present

## 2023-09-19 DIAGNOSIS — H43391 Other vitreous opacities, right eye: Secondary | ICD-10-CM | POA: Diagnosis not present

## 2023-09-27 DIAGNOSIS — A09 Infectious gastroenteritis and colitis, unspecified: Secondary | ICD-10-CM | POA: Diagnosis not present

## 2023-09-28 DIAGNOSIS — A09 Infectious gastroenteritis and colitis, unspecified: Secondary | ICD-10-CM | POA: Diagnosis not present

## 2023-11-08 DIAGNOSIS — M79672 Pain in left foot: Secondary | ICD-10-CM | POA: Diagnosis not present

## 2023-11-08 DIAGNOSIS — M79671 Pain in right foot: Secondary | ICD-10-CM | POA: Diagnosis not present

## 2023-11-13 DIAGNOSIS — M6281 Muscle weakness (generalized): Secondary | ICD-10-CM | POA: Diagnosis not present

## 2023-11-13 DIAGNOSIS — M7671 Peroneal tendinitis, right leg: Secondary | ICD-10-CM | POA: Diagnosis not present

## 2023-11-13 DIAGNOSIS — M7672 Peroneal tendinitis, left leg: Secondary | ICD-10-CM | POA: Diagnosis not present

## 2023-11-26 DIAGNOSIS — M79671 Pain in right foot: Secondary | ICD-10-CM | POA: Diagnosis not present

## 2023-11-26 DIAGNOSIS — M79672 Pain in left foot: Secondary | ICD-10-CM | POA: Diagnosis not present

## 2023-11-27 DIAGNOSIS — R197 Diarrhea, unspecified: Secondary | ICD-10-CM | POA: Diagnosis not present

## 2023-12-25 ENCOUNTER — Ambulatory Visit: Admitting: Internal Medicine
